# Patient Record
Sex: Female | Born: 1945 | Race: White | Hispanic: No | Marital: Married | Smoking: Never smoker
Health system: Southern US, Community
[De-identification: ages and names within clinical notes are randomized; demographics above are authoritative.]

## PROBLEM LIST (undated history)

## (undated) DIAGNOSIS — I10 Essential (primary) hypertension: Secondary | ICD-10-CM

## (undated) DIAGNOSIS — C50919 Malignant neoplasm of unspecified site of unspecified female breast: Secondary | ICD-10-CM

## (undated) DIAGNOSIS — E7800 Pure hypercholesterolemia, unspecified: Secondary | ICD-10-CM

## (undated) DIAGNOSIS — E559 Vitamin D deficiency, unspecified: Secondary | ICD-10-CM

## (undated) DIAGNOSIS — G300 Alzheimer's disease with early onset: Secondary | ICD-10-CM

## (undated) HISTORY — PX: RECONSTRUCTION BREAST W/ TRAM FLAP: SUR1079

## (undated) HISTORY — DX: Malignant neoplasm of unspecified site of unspecified female breast: C50.919

## (undated) HISTORY — DX: Vitamin D deficiency, unspecified: E55.9

## (undated) HISTORY — DX: Pure hypercholesterolemia, unspecified: E78.00

## (undated) HISTORY — DX: Essential (primary) hypertension: I10

---

## 1997-12-25 ENCOUNTER — Ambulatory Visit (HOSPITAL_COMMUNITY): Admission: RE | Admit: 1997-12-25 | Discharge: 1997-12-25 | Payer: Self-pay | Admitting: General Surgery

## 2002-05-24 HISTORY — PX: MASTECTOMY: SHX3

## 2002-07-13 ENCOUNTER — Ambulatory Visit (HOSPITAL_COMMUNITY): Admission: RE | Admit: 2002-07-13 | Discharge: 2002-07-13 | Payer: Self-pay | Admitting: Hematology and Oncology

## 2002-07-13 ENCOUNTER — Encounter: Payer: Self-pay | Admitting: Hematology and Oncology

## 2002-07-16 ENCOUNTER — Ambulatory Visit (HOSPITAL_COMMUNITY): Admission: RE | Admit: 2002-07-16 | Discharge: 2002-07-16 | Payer: Self-pay | Admitting: Hematology and Oncology

## 2002-07-16 ENCOUNTER — Encounter: Payer: Self-pay | Admitting: Hematology and Oncology

## 2002-07-18 ENCOUNTER — Encounter: Payer: Self-pay | Admitting: General Surgery

## 2002-07-18 ENCOUNTER — Other Ambulatory Visit: Admission: RE | Admit: 2002-07-18 | Discharge: 2002-07-18 | Payer: Self-pay | Admitting: *Deleted

## 2002-07-18 ENCOUNTER — Encounter: Admission: RE | Admit: 2002-07-18 | Discharge: 2002-07-18 | Payer: Self-pay | Admitting: General Surgery

## 2002-10-30 ENCOUNTER — Encounter: Admission: RE | Admit: 2002-10-30 | Discharge: 2002-10-30 | Payer: Self-pay | Admitting: Hematology and Oncology

## 2002-10-30 ENCOUNTER — Encounter: Payer: Self-pay | Admitting: Hematology and Oncology

## 2003-04-05 ENCOUNTER — Ambulatory Visit (HOSPITAL_COMMUNITY): Admission: RE | Admit: 2003-04-05 | Discharge: 2003-04-05 | Payer: Self-pay | Admitting: Plastic Surgery

## 2003-04-05 ENCOUNTER — Ambulatory Visit (HOSPITAL_BASED_OUTPATIENT_CLINIC_OR_DEPARTMENT_OTHER): Admission: RE | Admit: 2003-04-05 | Discharge: 2003-04-05 | Payer: Self-pay | Admitting: Plastic Surgery

## 2003-04-26 ENCOUNTER — Encounter (INDEPENDENT_AMBULATORY_CARE_PROVIDER_SITE_OTHER): Payer: Self-pay | Admitting: Specialist

## 2003-04-26 ENCOUNTER — Inpatient Hospital Stay (HOSPITAL_COMMUNITY): Admission: RE | Admit: 2003-04-26 | Discharge: 2003-04-30 | Payer: Self-pay | Admitting: Surgery

## 2003-08-30 ENCOUNTER — Ambulatory Visit (HOSPITAL_BASED_OUTPATIENT_CLINIC_OR_DEPARTMENT_OTHER): Admission: RE | Admit: 2003-08-30 | Discharge: 2003-08-30 | Payer: Self-pay | Admitting: Plastic Surgery

## 2003-09-10 ENCOUNTER — Ambulatory Visit (HOSPITAL_COMMUNITY): Admission: RE | Admit: 2003-09-10 | Discharge: 2003-09-10 | Payer: Self-pay | Admitting: Internal Medicine

## 2006-11-14 ENCOUNTER — Encounter: Admission: RE | Admit: 2006-11-14 | Discharge: 2006-11-14 | Payer: Self-pay | Admitting: Hematology and Oncology

## 2007-11-15 ENCOUNTER — Encounter: Admission: RE | Admit: 2007-11-15 | Discharge: 2007-11-15 | Payer: Self-pay | Admitting: Hematology and Oncology

## 2010-10-09 NOTE — Op Note (Signed)
NAME:  Destiny Norman, Destiny Norman                         ACCOUNT NO.:  [id number]   MEDICAL RECORD NO.:  [id number]                   PATIENT TYPE:  AMB   LOCATION:  DSC                                  FACILITY:  [facility]   PHYSICIAN:  Etter Sjogren, M.D.                  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   PROCEDURE PERFORMED:  Bilateral nipple reconstruction.   SURGEON:  Etter Sjogren, M.D.   CLINICAL NOTE:  This 65 year old woman has had breast cancer and has had  bilateral mastectomy.  She has had bilateral reconstruction with TRAM flaps  and now presents for her bilateral nipple reconstruction.  The procedure  risks were well-understood by her, and she wished to proceed.   DESCRIPTION OF PROCEDURE:  The patient was placed in a full standing  position and the planned sites for nipple reconstruction were marked.  These  were marked with a round spot Band-Aid and she viewed this in a mirror and  agreed with the placement.  She then returned to the room and was placed  supine.  The flaps were designed as a fleur-de-lis flap, inferiorly based.  She was prepped with Betadine and draped with sterile drapes.  Incisions  were made and the flaps were raised, leaving a generous subcutaneous pedicle  at the inferior aspect of the flap.  The flaps had excellent color and  bright red bleeding to the periphery consistent with viability.  The wounds  were irrigated thoroughly with saline and excellent hemostasis was  confirmed.  The donor site was closed with 3-0 Vicryl interrupted, inverted  deep sutures and 3-0 Vicryl and 4-0 chromic simple interrupted sutures for  the skin.  The flaps were then inset, first closing the flaps with  4-0  chromic simple interrupted sutures.  The flaps had excellent color and  bright red bleeding to the periphery consistent with viability.  Small areas  underneath the flaps were then de-epithelialized and the flaps were inset  there with 3-0 Vicryl simple interrupted sutures around the periphery.  Again, they both had excellent color and no undue tension.  Antibiotic  ointment, Xeroform gauze, and eye pads with a hole cut in the center were  applied and tape gently applied to this as a protection.  She tolerated the  procedure well.   DISPOSITION:  Will see her in the office next week.  Etter Sjogren, M.D.    DB/MEDQ  D:  08/30/2003  T:  08/30/2003  Job:  [id number]

## 2010-10-09 NOTE — Discharge Summary (Signed)
NAME:  Destiny Norman, Destiny Norman                         ACCOUNT NO.:  [id number]   MEDICAL RECORD NO.:  [id number]                   PATIENT TYPE:  INP   LOCATION:  5734                                 FACILITY:  [facility]   PHYSICIAN:  Etter Sjogren, M.D.                  DATE OF BIRTH:  Jan 24, 1946   DATE OF ADMISSION:  04/26/2003  DATE OF DISCHARGE:  04/30/2003                                 DISCHARGE SUMMARY   FINAL DIAGNOSIS:  Breast cancer.   PROCEDURE PERFORMED:  Left total mastectomy and bilateral breast  reconstruction using transverse ___________ flaps.   HISTORY OF PRESENT ILLNESS:  The patient is a 65 year old woman who has had  right breast cancer and now is presenting for a left mastectomy.  She  desires breast reconstruction and already has had the delayed procedure of  her TRAM flaps.  She will have reconstruction with bilateral TRAM flaps as  well.  Anesthesia and risks, possible complications were well understood by  her and she wished to proceed.   HOSPITAL COURSE:  On admission, she was taken to surgery at which time the  left mastectomy and the bilateral TRAM flap breast reconstructions were  performed.  She tolerated this very well.  Postoperatively, she did well.  Postoperative hemoglobin was 10.2, hematocrit 29.8.  Electrolytes within  normal limits.  The day following surgery, she was doing well.  She did  respond a fluid bolus during the first postoperative night.  She began to  diurese and the flaps had excellent color and excellent capillary refill, as  did the abdominal wall closure.  Drains were functioning.  No evidence of  any bleeding problems.  She was tolerating clear liquids.  Gradually her  diet was advanced.  She was begun on ambulation.  Antibiotics were continued  for three days intravenously.  She remained afebrile.  On the fourth  postoperative day it was felt that she could be discharged.   DISPOSITION:  Discharged home on a regular diet.  The patient  is to  __________ three times a day and record.   ACTIVITY:  No lifting, no vigorous activities, no raising arms over head.  No shower yet.   DISCHARGE MEDICATIONS:  Darvocet-N 100.  A total of 30 given, one p.o. q.4-  6h. p.r.n. pain.   FOLLOWUP:  1. See me back in the office in three days if the drainage is down, or six     days if the drainage is continuing this week.  She will call our nurse to     discuss that.  2. She will follow up with Dr. Ovidio Kin of general surgery in a few     weeks.  Etter Sjogren, M.D.    DB/MEDQ  D:  04/30/2003  T:  05/01/2003  Job:  [id number]

## 2010-10-09 NOTE — Op Note (Signed)
NAME:  EMILIJA, BOHMAN                         ACCOUNT NO.:  [id number]   MEDICAL RECORD NO.:  [id number]                   PATIENT TYPE:  AMB   LOCATION:  DAY                                  FACILITY:  [facility]   PHYSICIAN:  Lionel December, M.D.                 DATE OF BIRTH:  05-09-1946   DATE OF PROCEDURE:  09/10/2003  DATE OF DISCHARGE:                                 OPERATIVE REPORT   PROCEDURE:  Total colonoscopy.   ENDOSCOPIST:  Lionel December, M.D.   INDICATIONS:  Destiny Norman is a 65 year old Caucasian female who is undergoing  screening colonoscopy.  Family history is negative for colorectal carcinoma.  Personal history is positive for breast carcinoma and she remains in  remission.  She has occasional hematochezia felt to be secondary to  hemorrhoids.  The procedure and risks were reviewed with the patient and  informed consent was obtained.   PREOPERATIVE MEDICATIONS:  Demerol 50 mg IV and Versed 5 mg IV in divided  dose.   FINDINGS:  Procedure performed in endoscopy suite.  The patient's vital  signs and O2 saturation were monitored during the procedure and remained  stable.  The patient was placed in the left lateral recumbent position and  rectal examination was performed.  No abnormality noted on external or  digital exam.   Olympus videoscope was placed in the rectum and advanced under vision into  the sigmoid colon and beyond.  Redundant colon with excellent preparation.  The scope was slowly and carefully advanced to cecum which was identified by  appendiceal orifice and the ileocecal valve.  Pictures were taken for the  record.  As the scope was withdrawn, the colonic mucosa was carefully  examined and no polyps and/or tumor masses noted.  Rectal mucosa was normal.   The scope was retroflexed to examine the anorectal junction.  She had  hemorrhoids above and below the dentate line; 1 above the dentate line as  prominent and had mucosal erythema and irritation.  The  endoscope was  straightened and withdrawn.  The patient tolerated the procedure well.   FINAL DIAGNOSIS:  Normal colonoscopy except internal and external  hemorrhoids felt to be a source of intermittent or occasional hematochezia.   RECOMMENDATIONS:  1. She should continue high fiber diet.  2. Yearly Hemoccults.  3. She may consider her next screening in 10 years from now.      ___________________________________________                                            Lionel December, M.D.   NR/MEDQ  D:  09/10/2003  T:  09/10/2003  Job:  [id number]   cc:   Dr. Aneta Mins, M.D.  [address] [place]  [place] [address]  Fax: [phone]

## 2010-10-09 NOTE — Op Note (Signed)
NAME:  Destiny Norman, Destiny Norman                         ACCOUNT NO.:  [id number]   MEDICAL RECORD NO.:  [id number]                   PATIENT TYPE:  OIB   LOCATION:  5734                                 FACILITY:  [facility]   PHYSICIAN:  Etter Sjogren, M.D.                  DATE OF BIRTH:  [birth year]   DATE OF PROCEDURE:  04/26/2003  DATE OF DISCHARGE:                                 OPERATIVE REPORT   PREOPERATIVE DIAGNOSIS:  Breast cancer.   POSTOPERATIVE DIAGNOSIS:  Breast cancer.   PROCEDURE:  Bilateral breast reconstruction, right and left side, using a  transverse rectus abdominus myocutaneous flaps.   SURGEON:  Etter Sjogren, M.D.   ASSISTANT:  Pleas Patricia, M.D.   ANESTHESIA:  General.   ESTIMATED BLOOD LOSS:  Was 300 mL.   DRAINS:  Two Blakes in the abdomen, one Blake in each chest.   INDICATIONS FOR PROCEDURE:  This 65 year old woman with breast cancer,  having a mastectomy today on the left side, and already had a mastectomy on  the right side.  She desired a reconstruction.  She elected to have  autogenous tissue using a TRAM flap.  These TRAM flaps have been delayed  bilaterally.  The initial procedure and risks plus the complications were  discussed with her, and she understood the initial procedure and wished to  proceed.  This was a planned staged procedure.  The delay had been performed  three weeks prior.   DESCRIPTION OF PROCEDURE:  The patient was taken to the operating room and  placed supine.  After the successful induction of general anesthesia, she  was prepped with Betadine and draped with sterile drapes.  The old  mastectomy scar on the right side was excised, and the right defect  developed, dissecting in the subcutaneous plane above the muscle using  electrocautery.  These incisions with the electrocautery.  A Blake  transposition brought through a separate stab wound inferolaterally and  secured with a #3-0 Prolene suture.  Moist lap was placed.   An  incision was then made around the umbilicus.  The fatty stalk was left  with the umbilicus for viability.  The upper limb of the incision was then  made and the dissection carried down to the underlying fascia, beveling in a  cephalad direction, in order to capture more perforators in the medial  aspect.  The abdominal skin and flap was then elevated off of the underlying  muscle, taking great care to avoid damage to underlying rectus fascia.  The  subcutaneous tunnel was then developed on the right side through and  through, to connect to the right chest.  The lower incision was then made,  and the flap was then divided in the middle, one for each side.  Dissection  was carried out midline for a very short distance a few mm, in order to have  a fascial edge there.  The flaps were elevated out laterally up to the  lateral row of perforators.  Parallel incisions were made in the anterior  rectus fascia, leaving a strip of fascia on the anterior surface of the  muscle approximately 2.0 cm in width.  The muscles were then gently  dissected free from the surrounding fascia using the bipolar as well as the  scalpel, especially at the tendinous inscriptions.  Once these had been  freed completely on the right side, the lateral perforators were identified  and were ligated with Ligaclips and divided.  The muscle was then divided on  the right side distally, and a flap was then elevated and passed into the  subcutaneous tunnel on the right side.   Dr. Sandria Bales. Newman came in and did the total mastectomy at this point on  the left, and then the muscle was tied on the left side, freeing that muscle  flap.  It was passed through the subcutaneous tunnel as well, and both sides  were positioned on the chest and temporarily stapled in position.  Both  flaps had excellent color and capillary refill with bright red bleeding into  the periphery, consistent with viability.   Attention was then directed  back to the abdomen.  A thorough irrigation with  saline.  Hemostasis with electrocautery.  A portion of the fascia, both  superiorly and inferiorly were closed with #0 Prolene interrupted figure-of-  eight sutures.  Then when this became too tight, this closure was stopped at  that point.  Again after checking for meticulous hemostasis, the Marlex mesh  was then positioned and secured around the periphery with some #0 Prolene  horizontal mattress sutures, again taking great care to avoid damage to the  intra-abdominal contents, as had been done throughout this procedure.  The  mesh was then secured around its periphery using a running #2-0 Prolene  simple suture.  An incision was then made in the middle of the mesh to bring  the umbilicus up through.  The umbilicus had excellent color.  A thorough  irrigation with saline again.  Excellent hemostasis achieved using  electrocautery.  A Blake drain positioned on each side and brought out  through a separate stab wound inferiorly and secured with #3-0 Prolene  suture.  The closure with #2-0 Vicryl interrupted and deep sutures  centrally, #3-0 Monocryl interrupted inverted deep sutures out laterally.  The umbilicus was brought out through the midline and secured with #3-0  Vicryl interrupted inverted deep sutures.  Then #3-0 Monocryl running  subcuticular suture was then placed for the abdominal closure.  All of this  was with the patient in a semi-Fowler's position.  The abdominal skin had  excellent color.   The TRAM flaps were then inset, closing the inferior borders edge to edge,  using #3-0 Monocryl interrupted inverted deep sutures.  The degrees-  epithelialization was performed superiorly, and this was marked.  Suspension  sutures as needed, #3-0 Vicryl interrupted horizontal mattress sutures  superiorly, and #3-0 Monocryl interrupted inverted deep sutures, and #3-0 Monocryl inverted subcuticular suture for the remainder of the  closure.  Again the flaps had excellent color and excellent capillary refill.  Prior  to the closure, the very tip of the flaps out laterally were excised  bilaterally.  There was bright red bleeding there, consistent with  viability.  Steri-Strips and a very light dry sterile dressing applied.  The  patient was turned to the semi-Fowler's position.   She was transferred  to her hospital bed, and transferred to the recovery  room in stable condition.  She tolerated the procedure well.  This was a  very lengthy procedure, requiring approximately 6 to 6-1/2 hours of  operating time.                                               Etter Sjogren, M.D.    DB/MEDQ  D:  04/26/2003  T:  04/28/2003  Job:  [id number]

## 2010-10-09 NOTE — Op Note (Signed)
NAME:  Destiny Norman, Destiny Norman                         ACCOUNT NO.:  [id number]   MEDICAL RECORD NO.:  [id number]                   PATIENT TYPE:  AMB   LOCATION:  DSC                                  FACILITY:  [facility]   PHYSICIAN:  Etter Sjogren, M.D.                  DATE OF BIRTH:  12-25-1945   DATE OF PROCEDURE:  04/05/2003  DATE OF DISCHARGE:                                 OPERATIVE REPORT   PREOPERATIVE DIAGNOSIS:  Right  breast cancer, surgical absence of the right  breast.   POSTOPERATIVE DIAGNOSIS:  Right  breast cancer, surgical absence of the  right breast.   PROCEDURE PERFORMED:  Bilateral delay of a transverse rectus abdominis  muscle flap.   SURGEON:  Etter Sjogren, M.D.   ANESTHESIA:  General.   ESTIMATED BLOOD LOSS:  Minimal.   DRAINS:  One Harrison Mons was left.   CLINICAL NOTE:  65 year old woman has right breast cancer and has had a  mastectomy.  She desires breast reconstruction.  In addition, she will be  having a left mastectomy at the time of her reconstruction by her general  surgeon.  She desires a reconstruction and various options were discussed  and she preferred the TRAM flap reconstruction.  This will be a bilateral  reconstruction.  She presents today for a bilateral delay of that surgery.  The procedure, risks, and possible complications of the TRAM flap were  discussed with her in great detail and she understood those risks and wished  to proceed.   DESCRIPTION OF PROCEDURE:  The patient was marked in a full standing  position for the lower abdominal incision. She was taken to the operating  room and placed supine.  After successful induction of general anesthesia,  she was prepped with Betadine and draped with sterile drapes.  The lower  abdominal incision was then made from the anterior iliac crest to iliac  crest.  The dissection was carried down through the subcutaneous tissue  using electrocautery.  The underlying fascia was identified.  The fascia  was  opened just lateral to the lateral border of the rectus muscle on each side  and the deep epigastric vessels were identified.  These were carefully  doubly and triply ligated using the Liga clips.  Thorough irrigation with  saline and excellent hemostasis was noted.  The closure was 0 Prolene  interrupted figure-of-eight sutures.  0.5% Marcaine was placed in the  fascia.  The wounds were thoroughly irrigated with saline.  Excellent  hemostasis having been confirmed, the Blake drain was positioned and brought  out through the lateral aspect of the wound and secured with 3-0 Prolene  suture.  The closure in layers with 3-0 Monocryl interrupted or deep  sutures, 4-0 Monocryl running subcuticular tissues.  Steri-Strips and dry,  sterile dressing was applied.  Abdominal binder applied.  The patient  tolerated the procedure well.  Etter Sjogren, M.D.    DB/MEDQ  D:  04/05/2003  T:  04/05/2003  Job:  [id number]

## 2010-10-09 NOTE — Op Note (Signed)
NAME:  Destiny Norman, Destiny Norman                         ACCOUNT NO.:  [id number]   MEDICAL RECORD NO.:  [id number]                   PATIENT TYPE:  OIB   LOCATION:  5734                                 FACILITY:  [facility]   PHYSICIAN:  Sandria Bales. Ezzard Standing, M.D.               DATE OF BIRTH:  July 04, 1945   DATE OF PROCEDURE:  04/26/2003  DATE OF DISCHARGE:                                 OPERATIVE REPORT   PREOPERATIVE DIAGNOSIS:  Stage 1 carcinoma of the right breast, plan for  prophylactic mastectomy  of the left breast.   POSTOPERATIVE DIAGNOSIS:  Stage 1 carcinoma of the right breast, plan for  prophylactic mastectomy  of the left breast.   PROCEDURE:  Left simple mastectomy  by Dr. Ovidio Kin, TRAM reconstruction  by Dr. Etter Sjogren.   ANESTHESIA:  General endotracheal anesthesia.   ESTIMATED BLOOD LOSS:  100 mL.   DRAINS:  Per Dr. Odis Luster.   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 She subsequently developed an invasive cancer of the right breast,  treated with a right mastectomy  and sentinel lymph node biopsy in March  2004. She had 2 tumors; one 1.4 cm and the other 9 mm. She had a negative  sentinel lymph node. She has been under treatment by Dr. Margo Common with  remidex and doing well with this, but is interested in 2 things:  First a  prophylactic mastectomy  of the left side because of increased  risk of  breast cancer, and secondly reconstruction for which she will see Dr. Etter Sjogren.   I discussed with her the indications and potential complications of the  procedure. The potential complications include but are not limited to  bleeding, infection, nerve injury. The plan is for me to do a left simple  mastectomy  and Dr. Etter Sjogren will do a bilateral TRAM reconstruction of  her breast.   DESCRIPTION OF PROCEDURE:  The patient underwent  a general  intravenous  anesthetic. She had PAF stockings placed and was given 1 gm of Ancef at the  initiation of the procedure. She was placed in the supine position. Dr.  Odis Luster actually had already done much of the right TRAM reconstruction. By  the time I got in the operating room he had probably been operating for 1-  1/2 to 2 hours. At this time he broke scrub and I started on her left  mastectomy.   I marked her breast, excising her skin  and making an elliptical incision  around the nipple. I then went medial to the lateral edge of the sternum  inferior to the vesting fascia of the rectus abdominus superiorly to about 2  fingerbreadths below the clavicle and laterally out to the latissimus dorsi  muscle. I then  resected the breast off the chest wall using bovie  electrocautery and 3-0 Vicryl sutures for hemostasis.   After completion of the mastectomy, she had an open wound on the left side.  Dr. Odis Luster was then going to work on the reconstruction on the left side. So  I then broke scrub and Dr. Odis Luster came back in to complete the bilateral  TRAM reconstruction.   The patient tolerated the procedure well during my procedure. Her blood loss  was maybe 75 to 100 mL. Again the drains will be dictated by Dr. Odis Luster and  wound closure dictated by Dr. Odis Luster. Final pathology is pending  at the  time of dictation.                                               Sandria Bales. Ezzard Standing, M.D.    DHN/MEDQ  D:  04/26/2003  T:  04/27/2003  Job:  [id number]   cc:   Destiny Norman  [address]  [place]  [place] [address]  Fax: [phone]   Etter Sjogren, M.D.  [address]  [address]  [place]  [place] [address]  Fax: [phone]   Lynett Fish, M.D.  [address] [place]  [place] [address]  Fax: [phone]

## 2010-12-07 ENCOUNTER — Other Ambulatory Visit: Payer: Self-pay | Admitting: Unknown Physician Specialty

## 2010-12-07 DIAGNOSIS — Z9011 Acquired absence of right breast and nipple: Secondary | ICD-10-CM

## 2010-12-07 DIAGNOSIS — Z1231 Encounter for screening mammogram for malignant neoplasm of breast: Secondary | ICD-10-CM

## 2010-12-15 ENCOUNTER — Ambulatory Visit
Admission: RE | Admit: 2010-12-15 | Discharge: 2010-12-15 | Disposition: A | Discharge status: Home / Self Care | Payer: BC Managed Care – PPO | Source: Ambulatory Visit | Attending: Unknown Physician Specialty | Admitting: Unknown Physician Specialty

## 2010-12-15 DIAGNOSIS — Z9011 Acquired absence of right breast and nipple: Secondary | ICD-10-CM

## 2010-12-15 DIAGNOSIS — Z1231 Encounter for screening mammogram for malignant neoplasm of breast: Secondary | ICD-10-CM

## 2011-09-07 ENCOUNTER — Other Ambulatory Visit: Payer: Self-pay | Admitting: Unknown Physician Specialty

## 2011-09-07 DIAGNOSIS — Z1231 Encounter for screening mammogram for malignant neoplasm of breast: Secondary | ICD-10-CM

## 2011-10-07 ENCOUNTER — Ambulatory Visit
Admission: RE | Admit: 2011-10-07 | Discharge: 2011-10-07 | Disposition: A | Discharge status: Home / Self Care | Payer: BC Managed Care – PPO | Source: Ambulatory Visit | Attending: Unknown Physician Specialty | Admitting: Unknown Physician Specialty

## 2011-10-07 DIAGNOSIS — Z1231 Encounter for screening mammogram for malignant neoplasm of breast: Secondary | ICD-10-CM

## 2012-05-11 ENCOUNTER — Ambulatory Visit (INDEPENDENT_AMBULATORY_CARE_PROVIDER_SITE_OTHER): Payer: Medicare Other | Admitting: Otolaryngology

## 2012-05-11 DIAGNOSIS — H6120 Impacted cerumen, unspecified ear: Secondary | ICD-10-CM

## 2012-05-11 DIAGNOSIS — H6980 Other specified disorders of Eustachian tube, unspecified ear: Secondary | ICD-10-CM

## 2012-05-11 DIAGNOSIS — J310 Chronic rhinitis: Secondary | ICD-10-CM

## 2014-01-04 ENCOUNTER — Encounter (HOSPITAL_COMMUNITY): Payer: 59

## 2014-01-04 ENCOUNTER — Encounter (HOSPITAL_COMMUNITY): Payer: 59 | Attending: Hematology and Oncology

## 2014-01-04 ENCOUNTER — Encounter (HOSPITAL_COMMUNITY): Payer: Self-pay

## 2014-01-04 VITALS — BP 164/85 | HR 76 | Temp 98.7°F | Resp 18 | Ht 59.5 in | Wt 124.0 lb

## 2014-01-04 DIAGNOSIS — Z170 Estrogen receptor positive status [ER+]: Secondary | ICD-10-CM | POA: Insufficient documentation

## 2014-01-04 DIAGNOSIS — Z809 Family history of malignant neoplasm, unspecified: Secondary | ICD-10-CM | POA: Diagnosis not present

## 2014-01-04 DIAGNOSIS — C50911 Malignant neoplasm of unspecified site of right female breast: Secondary | ICD-10-CM

## 2014-01-04 DIAGNOSIS — E559 Vitamin D deficiency, unspecified: Secondary | ICD-10-CM | POA: Diagnosis not present

## 2014-01-04 DIAGNOSIS — I10 Essential (primary) hypertension: Secondary | ICD-10-CM | POA: Insufficient documentation

## 2014-01-04 DIAGNOSIS — E7800 Pure hypercholesterolemia, unspecified: Secondary | ICD-10-CM | POA: Diagnosis not present

## 2014-01-04 DIAGNOSIS — R071 Chest pain on breathing: Secondary | ICD-10-CM

## 2014-01-04 DIAGNOSIS — D0590 Unspecified type of carcinoma in situ of unspecified breast: Secondary | ICD-10-CM | POA: Diagnosis present

## 2014-01-04 DIAGNOSIS — Z8249 Family history of ischemic heart disease and other diseases of the circulatory system: Secondary | ICD-10-CM | POA: Diagnosis not present

## 2014-01-04 DIAGNOSIS — C50919 Malignant neoplasm of unspecified site of unspecified female breast: Secondary | ICD-10-CM

## 2014-01-04 DIAGNOSIS — Z880 Allergy status to penicillin: Secondary | ICD-10-CM | POA: Diagnosis not present

## 2014-01-04 DIAGNOSIS — Z9010 Acquired absence of unspecified breast and nipple: Secondary | ICD-10-CM | POA: Diagnosis not present

## 2014-01-04 DIAGNOSIS — R0789 Other chest pain: Secondary | ICD-10-CM

## 2014-01-04 DIAGNOSIS — Z7982 Long term (current) use of aspirin: Secondary | ICD-10-CM | POA: Diagnosis not present

## 2014-01-04 LAB — COMPREHENSIVE METABOLIC PANEL
ALT: 10 U/L (ref 0–35)
AST: 18 U/L (ref 0–37)
Albumin: 4.2 g/dL (ref 3.5–5.2)
Alkaline Phosphatase: 79 U/L (ref 39–117)
Anion gap: 15 (ref 5–15)
BUN: 16 mg/dL (ref 6–23)
CO2: 25 mEq/L (ref 19–32)
Calcium: 9.7 mg/dL (ref 8.4–10.5)
Chloride: 97 mEq/L (ref 96–112)
Creatinine, Ser: 0.83 mg/dL (ref 0.50–1.10)
GFR calc Af Amer: 83 mL/min — ABNORMAL LOW (ref >90)
GFR calc non Af Amer: 72 mL/min — ABNORMAL LOW (ref >90)
Glucose, Bld: 116 mg/dL — ABNORMAL HIGH (ref 70–99)
Potassium: 4.3 mEq/L (ref 3.7–5.3)
Sodium: 137 mEq/L (ref 137–147)
Total Bilirubin: 0.3 mg/dL (ref 0.3–1.2)
Total Protein: 8 g/dL (ref 6.0–8.3)

## 2014-01-04 LAB — CBC WITH DIFFERENTIAL/PLATELET
Basophils Absolute: 0 10*3/uL (ref 0.0–0.1)
Basophils Relative: 0 % (ref 0–1)
Eosinophils Absolute: 0.1 10*3/uL (ref 0.0–0.7)
Eosinophils Relative: 2 % (ref 0–5)
HCT: 40.3 % (ref 36.0–46.0)
Hemoglobin: 13.6 g/dL (ref 12.0–15.0)
Lymphocytes Relative: 24 % (ref 12–46)
Lymphs Abs: 1.2 10*3/uL (ref 0.7–4.0)
MCH: 29.6 pg (ref 26.0–34.0)
MCHC: 33.7 g/dL (ref 30.0–36.0)
MCV: 87.6 fL (ref 78.0–100.0)
Monocytes Absolute: 0.4 10*3/uL (ref 0.1–1.0)
Monocytes Relative: 7 % (ref 3–12)
Neutro Abs: 3.3 10*3/uL (ref 1.7–7.7)
Neutrophils Relative %: 67 % (ref 43–77)
Platelets: 293 10*3/uL (ref 150–400)
RBC: 4.6 MIL/uL (ref 3.87–5.11)
RDW: 13.1 % (ref 11.5–15.5)
WBC: 4.9 10*3/uL (ref 4.0–10.5)

## 2014-01-04 NOTE — Progress Notes (Signed)
[place] A. Barnet Glasgow, M.D.  NEW PATIENT EVALUATION   Name: Destiny Norman Date: 01/04/2014 MRN: [id number] DOB: July 30, 1945  PCP: Delphina Cahill, MD   REFERRING PHYSICIAN: No ref. provider found  REASON FOR REFERRAL: Followup of breast cancer.     HISTORY OF PRESENT ILLNESS:Destiny Norman is a 68 y.o. female referred by her family physician for evaluation of new symptomatology. She had been diagnosed with lobular carcinoma in situ and was started preventatively on Evista and have taken the drug for nearly 5 years when she developed an abnormality in her right breast which on biopsy was consistent with ER positive infiltrating duct cell carcinoma. 2 lesions were ultimately found and she underwent right mastectomy with sentinel node biopsy, prophylactic left mastectomy with TRAM reconstruction. The TRAM reconstruction was performed 9 months after initial mastectomy on the right. She was then treated with anastrozole for 5 years ending in April of 2009. Right from the Star, she noticed some dissymmetry of her right breast reconstruction but never did she experience pain. The skin itself is numb. She denies any right upper extremity swelling, sore throat, chest pain, PND, orthopnea, palpitations, abdominal pain, nausea, vomiting, diarrhea, constipation, dysuria, hematuria, incontinence, lower extremity swelling or redness, skin rash, headache, or seizures.  PAST MEDICAL HISTORY:  has a past medical history of Breast cancer; High cholesterol; Vitamin D deficiency; and Hypertension.   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 Rx with anastrozole until April of 2009. 07/2002: Bilateral  mastectomy with TRAM reconstruction.  PAST SURGICAL HISTORY: Past Surgical History  Procedure Laterality Date  . Mastectomy Right 2004  . Cesarean section       CURRENT MEDICATIONS: has a current medication list which includes the following prescription(s): aspirin, vitamin d3, lisinopril-hydrochlorothiazide, melatonin, probiotic product, and simvastatin.   ALLERGIES: Penicillins   SOCIAL HISTORY:  reports that she has never smoked. She does not have any smokeless tobacco history on file. She reports that she drinks alcohol.   FAMILY HISTORY: family history includes Cancer in her brother; Hypertension in her mother.    REVIEW OF SYSTEMS:  Other than that discussed above is noncontributory.    PHYSICAL EXAM:  height is 4' 11.5" (1.511 m) and weight is 124 lb (56.246 kg). Her oral temperature is 98.7 F (37.1 C). Her blood pressure is 164/85 and her pulse is 76. Her respiration is 18.    GENERAL:alert, no distress and comfortable SKIN: skin color, texture, turgor are normal, no rashes or significant lesions EYES: normal, Conjunctiva are pink and non-injected, sclera clear OROPHARYNX:no exudate, no erythema and lips, buccal mucosa, and tongue normal  NECK: supple, thyroid normal size, non-tender, without nodularity CHEST:  Status post bilateral breast reconstruction with no masses felt. No subcutaneous nodules are appreciated in either reconstructed breast. LYMPH:  no palpable lymphadenopathy in the cervical, axillary or inguinal LUNGS: clear to auscultation and percussion with normal breathing effort HEART: regular rate & rhythm and no murmurs ABDOMEN:abdomen soft, non-tender and normal bowel sounds MUSCULOSKELETALl:no cyanosis of digits, no clubbing or edema  NEURO: alert & oriented x 3 with fluent speech, no focal motor/sensory deficits    LABORATORY DATA:  Appointment on 01/04/2014  Component Date Value Ref Range Status  . WBC  01/04/2014 4.9  4.0 - 10.5 K/uL Final  .  RBC 01/04/2014 4.60  3.87 - 5.11 MIL/uL Final  . Hemoglobin 01/04/2014 13.6  12.0 - 15.0 g/dL Final  . HCT 01/04/2014 40.3  36.0 - 46.0 % Final  . MCV 01/04/2014 87.6  78.0 - 100.0 fL Final  . MCH 01/04/2014 29.6  26.0 - 34.0 pg Final  . MCHC 01/04/2014 33.7  30.0 - 36.0 g/dL Final  . RDW 01/04/2014 13.1  11.5 - 15.5 % Final  . Platelets 01/04/2014 293  150 - 400 K/uL Final  . Neutrophils Relative % 01/04/2014 67  43 - 77 % Final  . Neutro Abs 01/04/2014 3.3  1.7 - 7.7 K/uL Final  . Lymphocytes Relative 01/04/2014 24  12 - 46 % Final  . Lymphs Abs 01/04/2014 1.2  0.7 - 4.0 K/uL Final  . Monocytes Relative 01/04/2014 7  3 - 12 % Final  . Monocytes Absolute 01/04/2014 0.4  0.1 - 1.0 K/uL Final  . Eosinophils Relative 01/04/2014 2  0 - 5 % Final  . Eosinophils Absolute 01/04/2014 0.1  0.0 - 0.7 K/uL Final  . Basophils Relative 01/04/2014 0  0 - 1 % Final  . Basophils Absolute 01/04/2014 0.0  0.0 - 0.1 K/uL Final  . Sodium 01/04/2014 137  137 - 147 mEq/L Final  . Potassium 01/04/2014 4.3  3.7 - 5.3 mEq/L Final  . Chloride 01/04/2014 97  96 - 112 mEq/L Final  . CO2 01/04/2014 25  19 - 32 mEq/L Final  . Glucose, Bld 01/04/2014 116* 70 - 99 mg/dL Final  . BUN 01/04/2014 16  6 - 23 mg/dL Final  . Creatinine, Ser 01/04/2014 0.83  0.50 - 1.10 mg/dL Final  . Calcium 01/04/2014 9.7  8.4 - 10.5 mg/dL Final  . Total Protein 01/04/2014 8.0  6.0 - 8.3 g/dL Final  . Albumin 01/04/2014 4.2  3.5 - 5.2 g/dL Final  . AST 01/04/2014 18  0 - 37 U/L Final  . ALT 01/04/2014 10  0 - 35 U/L Final  . Alkaline Phosphatase 01/04/2014 79  39 - 117 U/L Final  . Total Bilirubin 01/04/2014 0.3  0.3 - 1.2 mg/dL Final  . GFR calc non Af Amer 01/04/2014 72* >90 mL/min Final  . GFR calc Af Amer 01/04/2014 83* >90 mL/min Final   Comment: (NOTE)                          The eGFR has been calculated using the CKD EPI equation.                          This calculation has not been validated in all clinical situations.                           eGFR's persistently <90 mL/min signify possible Chronic Kidney                          Disease.  . Anion gap 01/04/2014 15  5 - 15 Final    Urinalysis No results found for this basename: colorurine,  appearanceur,  labspec,  phurine,  glucoseu,  hgbur,  bilirubinur,  ketonesur,  proteinur,  urobilinogen,  nitrite,  leukocytesur      _0 : Bone mineral density done on 12/03/2009 showed osteopenia.  PATHOLOGY: infiltrating duct cell carcinoma, ER/PR positive, HER-2/neu  not overexpressed with multi-focal disease, 0.9 cm and 1.2 cm respectively. Core biopsy was performed on 07/06/2002   IMPRESSION:  #1. 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 #2. Multifocal right breast cancer, 0.9 cm and 1.2 cm, ER/PR positive, HER-2/neu not overexpressed, status post right mastectomy with sentinel node biopsy performed on 08/01/2002 with subsequent left simple mastectomy 9 months later with bilateral TRAM reconstruction, no evidence of disease pending today's lab reports. #3. Hypertension, controlled.   PLAN:  #1. Lab tests were done today. #2. Suggested referral back to plastic surgeon. MRI of the right breast should probably be performed. #3. No further systemic therapy is recommended at this time. #4. Tentative followup in one year with CBC, chem profile, CEA, CA 27-29, and vitamin D level. #5. Bone mineral density every 2 years.  I appreciate the opportunity of sharing in her care.   Doroteo Bradford, MD 01/04/2014 7:22 PM   DISCLAIMER:  This note was dictated with voice recognition softwre.  Similar sounding words can inadvertently be transcribed inaccurately and may not be corrected upon review.

## 2014-01-04 NOTE — Patient Instructions (Signed)
[facility] Discharge Instructions  RECOMMENDATIONS MADE BY THE CONSULTANT AND ANY TEST RESULTS WILL BE SENT TO YOUR REFERRING PHYSICIAN.  Follow up with your surgeon who did your reconstructive surgery. Return here in one year for lab work and office visit.   Thank you for choosing [facility] to provide your oncology and hematology care.  To afford each patient quality time with our providers, please arrive at least 15 minutes before your scheduled appointment time.  With your help, our goal is to use those 15 minutes to complete the necessary work-up to ensure our physicians have the information they need to help with your evaluation and healthcare recommendations.    Effective January 1st, 2014, we ask that you re-schedule your appointment with our physicians should you arrive 10 or more minutes late for your appointment.  We strive to give you quality time with our providers, and arriving late affects you and other patients whose appointments are after yours.    Again, thank you for choosing [facility].  Our hope is that these requests will decrease the amount of time that you wait before being seen by our physicians.       _____________________________________________________________  Should you have questions after your visit to [facility], please contact our office at [phone] between the hours of 8:30 a.m. and 4:30 p.m.  Voicemails left after 4:30 p.m. will not be returned until the following business day.  For prescription refill requests, have your pharmacy contact our office with your prescription refill request.    _______________________________________________________________  We hope that we have given you very good care.  You may receive a patient satisfaction survey in the mail, please complete it and return it as soon as possible.  We value your  feedback!  _______________________________________________________________  Have you asked about our STAR program?  STAR stands for Survivorship Training and Rehabilitation, and this is a nationally recognized cancer care program that focuses on survivorship and rehabilitation.  Cancer and cancer treatments may cause problems, such as, pain, making you feel tired and keeping you from doing the things that you need or want to do. Cancer rehabilitation can help. Our goal is to reduce these troubling effects and help you have the best quality of life possible.  You may receive a survey from a nurse that asks questions about your current state of health.  Based on the survey results, all eligible patients will be referred to the [facility] program for an evaluation so we can better serve you!  A frequently asked questions sheet is available upon request.

## 2014-01-05 LAB — CEA: CEA: <0.5 ng/mL (ref 0.0–5.0)

## 2014-01-05 LAB — CANCER ANTIGEN 27.29: CA 27.29: 26 U/mL (ref 0–39)

## 2014-01-05 LAB — VITAMIN D 25 HYDROXY (VIT D DEFICIENCY, FRACTURES): Vit D, 25-Hydroxy: 53 ng/mL (ref 30–89)

## 2014-03-20 ENCOUNTER — Encounter (INDEPENDENT_AMBULATORY_CARE_PROVIDER_SITE_OTHER): Payer: Self-pay | Admitting: *Deleted

## 2014-04-10 ENCOUNTER — Other Ambulatory Visit (INDEPENDENT_AMBULATORY_CARE_PROVIDER_SITE_OTHER): Payer: Self-pay | Admitting: *Deleted

## 2014-04-10 ENCOUNTER — Encounter (INDEPENDENT_AMBULATORY_CARE_PROVIDER_SITE_OTHER): Payer: Self-pay | Admitting: *Deleted

## 2014-04-10 DIAGNOSIS — Z1211 Encounter for screening for malignant neoplasm of colon: Secondary | ICD-10-CM

## 2014-05-28 ENCOUNTER — Telehealth (INDEPENDENT_AMBULATORY_CARE_PROVIDER_SITE_OTHER): Payer: Self-pay | Admitting: *Deleted

## 2014-05-28 DIAGNOSIS — Z1211 Encounter for screening for malignant neoplasm of colon: Secondary | ICD-10-CM

## 2014-05-28 NOTE — Telephone Encounter (Signed)
Patient needs movi prep 

## 2014-05-31 MED ORDER — PEG-KCL-NACL-NASULF-NA ASC-C 100 G PO SOLR: 1.0000 | Freq: Once | ORAL | Status: DC

## 2014-06-04 ENCOUNTER — Telehealth (INDEPENDENT_AMBULATORY_CARE_PROVIDER_SITE_OTHER): Payer: Self-pay | Admitting: *Deleted

## 2014-06-04 NOTE — Telephone Encounter (Signed)
Referring MD/PCP: hall   Procedure: tcs  Reason/Indication:  screening  Has patient had this procedure before?  Yes, 10 yrs ago  If so, when, by whom and where?    Is there a family history of colon cancer?  no  Who?  What age when diagnosed?    Is patient diabetic?   no      Does patient have prosthetic heart valve?  no  Do you have a pacemaker?  no  Has patient ever had endocarditis? no  Has patient had joint replacement within last 12 months?  no  Does patient tend to be constipated or take laxatives? no  Is patient on Coumadin, Plavix and/or Aspirin? no  Medications: pravastatin 40 mg daily, lisinopril/hctz 10/12.5 mg daily, vit d3, melatonin, probiotic  Allergies: pcn  Medication Adjustment:   Procedure date & time: 07/04/14 at 830

## 2014-06-07 NOTE — Telephone Encounter (Signed)
agree

## 2014-07-04 ENCOUNTER — Encounter (HOSPITAL_COMMUNITY): Payer: Self-pay | Admitting: *Deleted

## 2014-07-04 ENCOUNTER — Ambulatory Visit (HOSPITAL_COMMUNITY)
Admission: RE | Admit: 2014-07-04 | Discharge: 2014-07-04 | Disposition: A | Discharge status: Home / Self Care | Payer: Medicare Other | Source: Ambulatory Visit | Attending: Internal Medicine | Admitting: Internal Medicine

## 2014-07-04 ENCOUNTER — Encounter (HOSPITAL_COMMUNITY)
Admission: RE | Disposition: A | Discharge status: Home / Self Care | Payer: Self-pay | Source: Ambulatory Visit | Attending: Internal Medicine

## 2014-07-04 DIAGNOSIS — E780 Pure hypercholesterolemia: Secondary | ICD-10-CM | POA: Diagnosis not present

## 2014-07-04 DIAGNOSIS — Q438 Other specified congenital malformations of intestine: Secondary | ICD-10-CM

## 2014-07-04 DIAGNOSIS — E559 Vitamin D deficiency, unspecified: Secondary | ICD-10-CM | POA: Diagnosis not present

## 2014-07-04 DIAGNOSIS — Z1211 Encounter for screening for malignant neoplasm of colon: Secondary | ICD-10-CM

## 2014-07-04 DIAGNOSIS — I10 Essential (primary) hypertension: Secondary | ICD-10-CM | POA: Insufficient documentation

## 2014-07-04 DIAGNOSIS — Z8249 Family history of ischemic heart disease and other diseases of the circulatory system: Secondary | ICD-10-CM | POA: Diagnosis not present

## 2014-07-04 DIAGNOSIS — Z79899 Other long term (current) drug therapy: Secondary | ICD-10-CM | POA: Diagnosis not present

## 2014-07-04 DIAGNOSIS — K6289 Other specified diseases of anus and rectum: Secondary | ICD-10-CM

## 2014-07-04 DIAGNOSIS — K648 Other hemorrhoids: Secondary | ICD-10-CM

## 2014-07-04 HISTORY — PX: COLONOSCOPY: SHX5424

## 2014-07-04 SURGERY — COLONOSCOPY
Anesthesia: Moderate Sedation

## 2014-07-04 MED ORDER — MEPERIDINE HCL 50 MG/ML IJ SOLN
INTRAMUSCULAR | Status: AC
  Filled 2014-07-04: qty 1

## 2014-07-04 MED ORDER — MIDAZOLAM HCL 5 MG/5ML IJ SOLN
INTRAMUSCULAR | Status: DC | PRN
  Administered 2014-07-04: 1 mg via INTRAVENOUS
  Administered 2014-07-04 (×2): 2 mg via INTRAVENOUS
  Administered 2014-07-04 (×2): 1 mg via INTRAVENOUS

## 2014-07-04 MED ORDER — MEPERIDINE HCL 50 MG/ML IJ SOLN
INTRAMUSCULAR | Status: DC | PRN
Start: 2014-07-04 — End: 2014-07-04
  Administered 2014-07-04 (×2): 25 mg via INTRAVENOUS

## 2014-07-04 MED ORDER — SODIUM CHLORIDE 0.9 % IV SOLN
INTRAVENOUS | Status: DC
  Administered 2014-07-04: 08:00:00 via INTRAVENOUS

## 2014-07-04 MED ORDER — STERILE WATER FOR IRRIGATION IR SOLN
Status: DC | PRN
  Administered 2014-07-04: 09:00:00

## 2014-07-04 MED ORDER — MIDAZOLAM HCL 5 MG/5ML IJ SOLN
INTRAMUSCULAR | Status: DC
Start: 2014-07-04 — End: 2014-07-04
  Filled 2014-07-04: qty 10

## 2014-07-04 NOTE — Op Note (Signed)
COLONOSCOPY PROCEDURE REPORT  PATIENT:  Destiny Norman  MR#:  [id number] Birthdate:  29-Apr-1946, 69 y.o., female Endoscopist:  Dr. Rogene Houston, MD Referred By:  Dr. Delphina Cahill, MD Procedure Date: 07/04/2014  Procedure:   Colonoscopy;incomplete to distal transverse colon.  Indications: Patient is 69 year old Caucasian female was undergoing average risk screening colonoscopy.  Informed Consent:  The procedure and risks were reviewed with the patient and informed consent was obtained.  Medications:  Demerol 50 mg IV Versed 7 mg IV  Description of procedure:  After a digital rectal exam was performed, that colonoscope was advanced from the anus through the rectum and colon to the area of the distal transverse colon. Splenic flexure is very tortuous. Scope could not be straightened after passing into distal transverse colon. Therefore cecum not reached. As the scope was withdrawn mucosa of descending and sigmoid colon was carefully examined. The scope was pulled down into the rectum where a thorough exam including retroflexion was performed.  Findings:   Prep excellent. Tortuous colon with recurrent loop formation. Therefore examination could not be completed. No abnormality noted in in the segments that were examined. Normal rectal mucosa. Hemorrhoids above and below the dentate line along with anal papillae.    Therapeutic/Diagnostic Maneuvers Performed:   None  Complications:  None  Cecal Withdrawal Time:  N/A as cecum not reached.  Impression:  Incomplete exam to distal transverse colon secondary to very tortuous sigmoid colon and splenic flexure.  Recommendations:  Standard instructions given. Virtual colonoscopy to be scheduled in 4 weeks.  Bailley Guilford U  07/04/2014 9:15 AM  CC: Dr. Delphina Cahill, MD & Dr. Rayne Du ref. provider found

## 2014-07-04 NOTE — Discharge Instructions (Signed)
Resume usual medications and diet. No driving for 24 hours. Virtual colonoscopy be scheduled in 4 weeks.  Colonoscopy, Care After Refer to this sheet in the next few weeks. These instructions provide you with information on caring for yourself after your procedure. Your health care provider may also give you more specific instructions. Your treatment has been planned according to current medical practices, but problems sometimes occur. Call your health care provider if you have any problems or questions after your procedure. WHAT TO EXPECT AFTER THE PROCEDURE  After your procedure, it is typical to have the following:  A small amount of blood in your stool.  Moderate amounts of gas and mild abdominal cramping or bloating. HOME CARE INSTRUCTIONS  Do not drive, operate machinery, or sign important documents for 24 hours.  You may shower and resume your regular physical activities, but move at a slower pace for the first 24 hours.  Take frequent rest periods for the first 24 hours.  Walk around or put a warm pack on your abdomen to help reduce abdominal cramping and bloating.  Drink enough fluids to keep your urine clear or pale yellow.  You may resume your normal diet as instructed by your health care provider. Avoid heavy or fried foods that are hard to digest.  Avoid drinking alcohol for 24 hours or as instructed by your health care provider.  Only take over-the-counter or prescription medicines as directed by your health care provider.  If a tissue sample (biopsy) was taken during your procedure:  Do not take aspirin or blood thinners for 7 days, or as instructed by your health care provider.  Do not drink alcohol for 7 days, or as instructed by your health care provider.  Eat soft foods for the first 24 hours. SEEK MEDICAL CARE IF: You have persistent spotting of blood in your stool 2-3 days after the procedure. SEEK IMMEDIATE MEDICAL CARE IF:  You have more than a small  spotting of blood in your stool.  You pass large blood clots in your stool.  Your abdomen is swollen (distended).  You have nausea or vomiting.  You have a fever.  You have increasing abdominal pain that is not relieved with medicine. Document Released: 12/23/2003 Document Revised: 02/28/2013 Document Reviewed: 01/15/2013 [facility] Patient Information 2015 [place], [place]. This information is not intended to replace advice given to you by your health care provider. Make sure you discuss any questions you have with your health care provider.

## 2014-07-04 NOTE — H&P (Signed)
Destiny Norman is an 69 y.o. female.   Chief Complaint: Patient is here for colonoscopy.Destiny Norman HPI: Patient is 69 year old Caucasian female who is undergoing screening colonoscopy. Her last exam was in 2005. She denies abdominal pain change in bowel habits or rectal bleeding.  Personal history is significant for breast carcinoma and she remains in remission. Family history is negative for CRC.  Past Medical History  Diagnosis Date  . Breast cancer   . High cholesterol   . Vitamin D deficiency   . Hypertension     Past Surgical History  Procedure Laterality Date  . Mastectomy Right 2004  . Cesarean section      Family History  Problem Relation Age of Onset  . Hypertension Mother   . Cancer Brother    Social History:  reports that she has never smoked. She does not have any smokeless tobacco history on file. She reports that she drinks alcohol. Her drug history is not on file.  Allergies:  Allergies  Allergen Reactions  . Penicillins Rash    Medications Prior to Admission  Medication Sig Dispense Refill  . Cholecalciferol (VITAMIN D3) 1000 UNITS CAPS Take 1,000 Units by mouth daily.    Destiny Norman lisinopril-hydrochlorothiazide (PRINZIDE,ZESTORETIC) 10-12.5 MG per tablet Take 1 tablet by mouth daily.    . Melatonin 5 MG CAPS Take 5 mg by mouth at bedtime as needed (sleep).     . peg 3350 powder (MOVIPREP) 100 G SOLR Take 1 kit (200 g total) by mouth once. 1 kit 0  . Probiotic Product (PROBIOTIC DAILY PO) Take 1 tablet by mouth daily.    . simvastatin (ZOCOR) 40 MG tablet Take 40 mg by mouth daily.    . vitamin B-12 (CYANOCOBALAMIN) 1000 MCG tablet Take 1,000 mcg by mouth daily.      No results found for this or any previous visit (from the past 48 hour(s)). No results found.  ROS  Blood pressure 133/69, pulse 61, temperature 98 F (36.7 C), temperature source Oral, resp. rate 16, SpO2 96 %. Physical Exam  Constitutional: She appears well-developed and well-nourished.  HENT:   Mouth/Throat: Oropharynx is clear and moist.  Eyes: Conjunctivae are normal. No scleral icterus.  Neck: No thyromegaly present.  Cardiovascular: Normal rate, regular rhythm and normal heart sounds.   No murmur heard. Respiratory: Effort normal and breath sounds normal.  GI: Soft. She exhibits no distension and no mass. There is no tenderness.  Long scar across lower abdomen.  Musculoskeletal: She exhibits no edema.  Lymphadenopathy:    She has no cervical adenopathy.  Neurological: She is alert.  Skin: Skin is warm and dry.     Assessment/Plan Average risk screening colonoscopy.  Jermiah Soderman U 07/04/2014, 8:31 AM

## 2014-07-05 ENCOUNTER — Encounter (HOSPITAL_COMMUNITY): Payer: Self-pay | Admitting: Internal Medicine

## 2014-07-09 ENCOUNTER — Other Ambulatory Visit (INDEPENDENT_AMBULATORY_CARE_PROVIDER_SITE_OTHER): Payer: Self-pay | Admitting: Internal Medicine

## 2014-07-09 DIAGNOSIS — Q438 Other specified congenital malformations of intestine: Secondary | ICD-10-CM

## 2014-08-06 ENCOUNTER — Ambulatory Visit
Admission: RE | Admit: 2014-08-06 | Discharge: 2014-08-06 | Disposition: A | Discharge status: Home / Self Care | Payer: Medicare Other | Source: Ambulatory Visit | Attending: Internal Medicine | Admitting: Internal Medicine

## 2014-08-06 DIAGNOSIS — Q438 Other specified congenital malformations of intestine: Secondary | ICD-10-CM

## 2014-08-14 ENCOUNTER — Other Ambulatory Visit (INDEPENDENT_AMBULATORY_CARE_PROVIDER_SITE_OTHER): Payer: Self-pay | Admitting: Internal Medicine

## 2014-08-14 DIAGNOSIS — D134 Benign neoplasm of liver: Secondary | ICD-10-CM

## 2014-09-05 ENCOUNTER — Encounter (HOSPITAL_COMMUNITY): Payer: Self-pay

## 2014-09-05 ENCOUNTER — Encounter (HOSPITAL_COMMUNITY)
Admission: RE | Admit: 2014-09-05 | Discharge: 2014-09-05 | Disposition: A | Discharge status: Home / Self Care | Payer: Medicare Other | Source: Ambulatory Visit | Attending: Internal Medicine | Admitting: Internal Medicine

## 2014-09-05 ENCOUNTER — Other Ambulatory Visit: Payer: Self-pay

## 2014-09-05 DIAGNOSIS — Z01810 Encounter for preprocedural cardiovascular examination: Secondary | ICD-10-CM | POA: Diagnosis not present

## 2014-09-05 DIAGNOSIS — Z01812 Encounter for preprocedural laboratory examination: Secondary | ICD-10-CM | POA: Insufficient documentation

## 2014-09-05 NOTE — Progress Notes (Signed)
PCP is Dr Wende Neighbors from [facility] (telephone number is [phone])  Pt states she had lab work done on 09-03-14 at Dr Juel Burrow office request sent for results.

## 2014-09-05 NOTE — Pre-Procedure Instructions (Signed)
Trish Mancinelli Gupta  09/05/2014  Your procedure is scheduled on:  April 19  Report to [facility] Admitting at 600 AM.  Call this number if you have problems the morning of surgery: [phone]   Remember:   Do not eat food or drink liquids after midnight.   Take these medicines the morning of surgery with A SIP OF WATER: Usual morning meds    Do not wear jewelry, make-up or nail polish.  Do not wear lotions, powders, or perfumes. You may wear deodorant.  Do not shave 48 hours prior to surgery. Men may shave face and neck.  Do not bring valuables to the hospital.  [facility] is not responsible   for any belongings or valuables.               Contacts, dentures or bridgework may not be worn into surgery.  Leave suitcase in the car. After surgery it may be brought to your room.  For patients admitted to the hospital, discharge time is determined by your   treatment team.               Patients discharged the day of surgery will not be allowed to drive home.    Special Instructions:    Please read over the following fact sheets that you were given: Pain Booklet and Coughing and Deep Breathing

## 2014-09-10 ENCOUNTER — Ambulatory Visit (HOSPITAL_COMMUNITY)
Admission: RE | Admit: 2014-09-10 | Discharge: 2014-09-10 | Disposition: A | Discharge status: Home / Self Care | Payer: Medicare Other | Source: Ambulatory Visit | Attending: Internal Medicine | Admitting: Internal Medicine

## 2014-09-10 ENCOUNTER — Ambulatory Visit (HOSPITAL_COMMUNITY): Payer: Medicare Other | Admitting: Anesthesiology

## 2014-09-10 ENCOUNTER — Encounter (HOSPITAL_COMMUNITY)
Admission: RE | Disposition: A | Discharge status: Home / Self Care | Payer: Medicare Other | Source: Ambulatory Visit | Attending: *Deleted

## 2014-09-10 ENCOUNTER — Encounter (HOSPITAL_COMMUNITY): Payer: Self-pay | Admitting: *Deleted

## 2014-09-10 DIAGNOSIS — Z853 Personal history of malignant neoplasm of breast: Secondary | ICD-10-CM | POA: Insufficient documentation

## 2014-09-10 DIAGNOSIS — Z9010 Acquired absence of unspecified breast and nipple: Secondary | ICD-10-CM | POA: Insufficient documentation

## 2014-09-10 DIAGNOSIS — D1803 Hemangioma of intra-abdominal structures: Secondary | ICD-10-CM | POA: Diagnosis not present

## 2014-09-10 DIAGNOSIS — K7689 Other specified diseases of liver: Secondary | ICD-10-CM | POA: Insufficient documentation

## 2014-09-10 DIAGNOSIS — D134 Benign neoplasm of liver: Secondary | ICD-10-CM

## 2014-09-10 DIAGNOSIS — R160 Hepatomegaly, not elsewhere classified: Secondary | ICD-10-CM | POA: Diagnosis present

## 2014-09-10 HISTORY — PX: RADIOLOGY WITH ANESTHESIA: SHX6223

## 2014-09-10 SURGERY — RADIOLOGY WITH ANESTHESIA
Anesthesia: General

## 2014-09-10 MED ORDER — GADOBENATE DIMEGLUMINE 529 MG/ML IV SOLN
10.0000 mL | Freq: Once | INTRAVENOUS | Status: AC | PRN
  Administered 2014-09-10: 10 mL via INTRAVENOUS

## 2014-09-10 MED ORDER — PROMETHAZINE HCL 25 MG/ML IJ SOLN: 6.2500 mg | INTRAMUSCULAR | Status: DC | PRN

## 2014-09-10 MED ORDER — HYDROMORPHONE HCL 1 MG/ML IJ SOLN: 0.2500 mg | INTRAMUSCULAR | Status: DC | PRN

## 2014-09-10 MED ORDER — OXYCODONE HCL 5 MG PO TABS: 5.0000 mg | ORAL_TABLET | Freq: Once | ORAL | Status: DC | PRN

## 2014-09-10 MED ORDER — OXYCODONE HCL 5 MG/5ML PO SOLN: 5.0000 mg | Freq: Once | ORAL | Status: DC | PRN

## 2014-09-10 NOTE — Anesthesia Preprocedure Evaluation (Addendum)
Anesthesia Evaluation  Patient identified by MRN, date of birth, ID band Patient awake    Reviewed: Allergy & Precautions, NPO status   History of Anesthesia Complications Negative for: history of anesthetic complications  Airway Mallampati: I  TM Distance: >3 FB     Dental  (+) Teeth Intact, Dental Advisory Given   Pulmonary neg pulmonary ROS,    Pulmonary exam normal       Cardiovascular hypertension, Rhythm:Regular     Neuro/Psych negative neurological ROS     GI/Hepatic negative GI ROS, Neg liver ROS,   Endo/Other  negative endocrine ROS  Renal/GU negative Renal ROS     Musculoskeletal   Abdominal (+)  Abdomen: soft.    Peds  Hematology negative hematology ROS (+)   Anesthesia Other Findings   Reproductive/Obstetrics                            Anesthesia Physical Anesthesia Plan  ASA: II  Anesthesia Plan: General   Post-op Pain Management:    Induction: Intravenous  Airway Management Planned: LMA and Oral ETT  Additional Equipment:   Intra-op Plan:   Post-operative Plan: Extubation in OR  Informed Consent: I have reviewed the patients History and Physical, chart, labs and discussed the procedure including the risks, benefits and alternatives for the proposed anesthesia with the patient or authorized representative who has indicated his/her understanding and acceptance.   Dental advisory given  Plan Discussed with:   Anesthesia Plan Comments:         Anesthesia Quick Evaluation

## 2014-09-10 NOTE — Anesthesia Postprocedure Evaluation (Signed)
  Anesthesia Post-op Note  Patient: Destiny Norman  Procedure(s) Performed: Procedure(s): MRI ABDOMIN WITH AND WITHOUT (N/A)  Patient Location: PACU  Anesthesia Type:General  Level of Consciousness: awake and alert   Airway and Oxygen Therapy: Patient Spontanous Breathing  Post-op Pain: none  Post-op Assessment: Post-op Vital signs reviewed  Post-op Vital Signs: stable  Last Vitals:  Filed Vitals:   09/10/14 1033  BP: 137/67  Pulse: 62  Temp:   Resp:     Complications: No apparent anesthesia complications

## 2014-09-10 NOTE — Transfer of Care (Signed)
Immediate Anesthesia Transfer of Care Note  Patient: Destiny Norman  Procedure(s) Performed: Procedure(s): MRI ABDOMIN WITH AND WITHOUT (N/A)  Patient Location: PACU  Anesthesia Type:General  Level of Consciousness: awake, alert , oriented and patient cooperative  Airway & Oxygen Therapy: Patient Spontanous Breathing  Post-op Assessment: Report given to RN, Post -op Vital signs reviewed and stable and Patient moving all extremities  Post vital signs: Reviewed and stable  Last Vitals:  Filed Vitals:   09/10/14 1012  BP:   Pulse: 77  Temp: 36.5 C  Resp: 18    Complications: No apparent anesthesia complications

## 2014-09-11 ENCOUNTER — Encounter (HOSPITAL_COMMUNITY): Payer: Self-pay | Admitting: Radiology

## 2014-09-13 MED FILL — Rocuronium Bromide IV Soln 100 MG/10ML (10 MG/ML): INTRAVENOUS | Qty: 10 | Status: AC

## 2014-09-13 MED FILL — Neostigmine Methylsulfate IV Soln 10 MG/10 ML (1 MG/ML): INTRAVENOUS | Qty: 1 | Status: AC

## 2014-09-13 MED FILL — Phenylephrine HCl Inj 10 MG/ML: INTRAMUSCULAR | Qty: 1 | Status: AC

## 2014-09-13 MED FILL — Lidocaine HCl IV Inj 20 MG/ML: INTRAVENOUS | Qty: 5 | Status: AC

## 2014-09-13 MED FILL — Glycopyrrolate Inj 0.2 MG/ML: INTRAMUSCULAR | Qty: 1 | Status: AC

## 2014-09-13 MED FILL — Midazolam HCl Inj 2 MG/2ML (Base Equivalent): INTRAMUSCULAR | Qty: 2 | Status: AC

## 2014-09-13 MED FILL — Fentanyl Citrate Preservative Free (PF) Inj 100 MCG/2ML: INTRAMUSCULAR | Qty: 2 | Status: AC

## 2014-12-25 ENCOUNTER — Other Ambulatory Visit (HOSPITAL_COMMUNITY): Payer: Self-pay

## 2014-12-26 ENCOUNTER — Other Ambulatory Visit (HOSPITAL_COMMUNITY): Payer: Self-pay

## 2014-12-26 DIAGNOSIS — C50919 Malignant neoplasm of unspecified site of unspecified female breast: Secondary | ICD-10-CM

## 2015-01-08 ENCOUNTER — Ambulatory Visit (HOSPITAL_COMMUNITY): Payer: Medicare Other | Admitting: Hematology & Oncology

## 2015-01-08 ENCOUNTER — Other Ambulatory Visit (HOSPITAL_COMMUNITY): Payer: Medicare Other

## 2015-02-14 ENCOUNTER — Encounter (HOSPITAL_COMMUNITY): Payer: Self-pay | Admitting: Hematology & Oncology

## 2015-02-14 ENCOUNTER — Encounter (HOSPITAL_COMMUNITY): Payer: Medicare Other | Attending: Hematology & Oncology | Admitting: Hematology & Oncology

## 2015-02-14 ENCOUNTER — Encounter (HOSPITAL_BASED_OUTPATIENT_CLINIC_OR_DEPARTMENT_OTHER): Payer: Medicare Other

## 2015-02-14 VITALS — BP 140/64 | HR 56 | Temp 98.4°F | Resp 14 | Wt 119.2 lb

## 2015-02-14 DIAGNOSIS — C50919 Malignant neoplasm of unspecified site of unspecified female breast: Secondary | ICD-10-CM

## 2015-02-14 DIAGNOSIS — Z9882 Breast implant status: Secondary | ICD-10-CM | POA: Insufficient documentation

## 2015-02-14 DIAGNOSIS — Z853 Personal history of malignant neoplasm of breast: Secondary | ICD-10-CM | POA: Diagnosis not present

## 2015-02-14 DIAGNOSIS — Z9889 Other specified postprocedural states: Secondary | ICD-10-CM

## 2015-02-14 LAB — CBC WITH DIFFERENTIAL/PLATELET
BASOS PCT: 1 %
Basophils Absolute: 0 10*3/uL (ref 0.0–0.1)
EOS ABS: 0.2 10*3/uL (ref 0.0–0.7)
EOS PCT: 4 %
HCT: 37 % (ref 36.0–46.0)
Hemoglobin: 12.1 g/dL (ref 12.0–15.0)
LYMPHS ABS: 1.4 10*3/uL (ref 0.7–4.0)
Lymphocytes Relative: 29 %
MCH: 28.8 pg (ref 26.0–34.0)
MCHC: 32.7 g/dL (ref 30.0–36.0)
MCV: 88.1 fL (ref 78.0–100.0)
Monocytes Absolute: 0.6 10*3/uL (ref 0.1–1.0)
Monocytes Relative: 11 %
NEUTROS PCT: 55 %
Neutro Abs: 2.8 10*3/uL (ref 1.7–7.7)
PLATELETS: 253 10*3/uL (ref 150–400)
RBC: 4.2 MIL/uL (ref 3.87–5.11)
RDW: 13.9 % (ref 11.5–15.5)
WBC: 5 10*3/uL (ref 4.0–10.5)

## 2015-02-14 LAB — COMPREHENSIVE METABOLIC PANEL
ALBUMIN: 4.1 g/dL (ref 3.5–5.0)
ALT: 12 U/L — ABNORMAL LOW (ref 14–54)
ANION GAP: 7 (ref 5–15)
AST: 24 U/L (ref 15–41)
Alkaline Phosphatase: 72 U/L (ref 38–126)
BUN: 10 mg/dL (ref 6–20)
CALCIUM: 9 mg/dL (ref 8.9–10.3)
CHLORIDE: 105 mmol/L (ref 101–111)
CO2: 27 mmol/L (ref 22–32)
Creatinine, Ser: 0.82 mg/dL (ref 0.44–1.00)
GFR calc non Af Amer: >60 mL/min (ref >60)
Glucose, Bld: 103 mg/dL — ABNORMAL HIGH (ref 65–99)
POTASSIUM: 4 mmol/L (ref 3.5–5.1)
SODIUM: 139 mmol/L (ref 135–145)
Total Bilirubin: 0.6 mg/dL (ref 0.3–1.2)
Total Protein: 7.4 g/dL (ref 6.5–8.1)

## 2015-02-14 NOTE — Patient Instructions (Addendum)
[facility] at [facility] Discharge Instructions  RECOMMENDATIONS MADE BY THE CONSULTANT AND ANY TEST RESULTS WILL BE SENT TO YOUR REFERRING PHYSICIAN.  Exam and discussion by Dr. Whitney Muse Report any new lumps, bone pain, shortness of breath or other symptoms. If any issues with your labs we will call you. Will release you back to Dr. Nevada Crane for follow-up. Thank you for choosing [facility] at [facility] to provide your oncology and hematology care.  To afford each patient quality time with our provider, please arrive at least 15 minutes before your scheduled appointment time.    You need to re-schedule your appointment should you arrive 10 or more minutes late.  We strive to give you quality time with our providers, and arriving late affects you and other patients whose appointments are after yours.  Also, if you no show three or more times for appointments you may be dismissed from the clinic at the providers discretion.     Again, thank you for choosing [facility].  Our hope is that these requests will decrease the amount of time that you wait before being seen by our physicians.       _____________________________________________________________  Should you have questions after your visit to [facility], please contact our office at [phone] between the hours of 8:30 a.m. and 4:30 p.m.  Voicemails left after 4:30 p.m. will not be returned until the following business day.  For prescription refill requests, have your pharmacy contact our office.

## 2015-02-14 NOTE — Progress Notes (Signed)
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 Rx with anastrozole until April of 2009. 07/2002: Bilateral mastectomy with TRAM reconstruction.  CURRENT THERAPY: Observation  INTERVAL HISTORY: Destiny Norman 69 y.o. 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   Destiny Norman is here alone today. Destiny Norman is feeling well and stays active by walking and going hiking with her husband, who is also retired. Her appetite is good. Destiny Norman currently follows with Dr. Nevada Crane.  Destiny Norman did not experience any breast pain after reconstruction for 9-10 years, when Destiny Norman began feeling pain Destiny Norman saw Dr. Barnet Glasgow. Her breast pain has lessened since then. Destiny Norman no longer notices it like Destiny Norman did. Destiny Norman used to find herself holding her breasts due to the pain, but no longer does. Her reconstruction was done by Dr. Harlow Mares.  In reference to muscle weakness after surgery, Destiny Norman remarks that in the beginning Destiny Norman was very cautious. Even still Destiny Norman avoids certain activities, like kayaking.   Destiny Norman has not been to her gynecologist because they do not perform mammograms anymore. Destiny Norman still has her uterus and ovaries.  Destiny Norman is up to date on her colonoscopies.   MEDICAL HISTORY: Past Medical History  Diagnosis Date  . Breast cancer   . High cholesterol   . Vitamin D deficiency   . Hypertension      does not have a problem list on file.     is allergic to penicillins.  SURGICAL HISTORY: Past Surgical History  Procedure Laterality Date  . Mastectomy Right 2004  . Cesarean section    . Colonoscopy N/A 07/04/2014   Procedure: COLONOSCOPY;  Surgeon: Rogene Houston, Destiny Norman;  [facility];  Service: Endoscopy;  Laterality: N/A;  830  . Reconstruction breast w/ tram flap    . Radiology with anesthesia N/A 09/10/2014    Procedure: MRI ABDOMIN WITH AND WITHOUT;  Surgeon: Medication Radiologist, Destiny Norman;  Location: [facility];  Service: Radiology;  Laterality: N/A;    SOCIAL HISTORY: Social History   Social History  . Marital Status: Married    Spouse Name: N/A  . Number of Children: N/A  . Years of Education: N/A   Occupational History  . Not on file.   Social History Main Topics  . Smoking status: Never Smoker   . Smokeless tobacco: Never Used  . Alcohol Use: Yes     Comment: occasional glass of wine  . Drug Use: No  . Sexual Activity: Yes   Other Topics Concern  . Not on file   Social History Narrative    FAMILY HISTORY: Family History  Problem Relation Age of Onset  . Hypertension Mother   . Cancer Brother     Review of Systems  Constitutional: Negative for fever, chills, weight loss and malaise/fatigue.  HENT: Negative for congestion, hearing loss, nosebleeds, sore throat and tinnitus.   Eyes: Negative for blurred vision, double vision, pain and discharge.  Respiratory: Negative for cough, hemoptysis, sputum production, shortness of breath and wheezing.   Cardiovascular: Negative for chest pain, palpitations, claudication, leg swelling and PND.  Gastrointestinal: Negative for heartburn, nausea, vomiting, abdominal pain,  diarrhea, constipation, blood in stool and melena.  Genitourinary: Negative for dysuria, urgency, frequency and hematuria.  Musculoskeletal: Negative for myalgias, joint pain and falls.  Skin: Negative for itching and rash.  Neurological: Negative for dizziness, tingling, tremors, sensory change, speech change, focal weakness, seizures, loss of consciousness, weakness and headaches.  Endo/Heme/Allergies: Does not bruise/bleed easily.  Psychiatric/Behavioral:  Negative for depression, suicidal ideas, memory loss and substance abuse. The patient is not nervous/anxious and does not have insomnia.     PHYSICAL EXAMINATION   ECOG PERFORMANCE STATUS: 0 - Asymptomatic  Filed Vitals:   02/14/15 1006  BP: 140/64  Pulse: 56  Temp: 98.4 F (36.9 C)  Resp: 14    Physical Exam  Constitutional: Destiny Norman is oriented to person, place, and time and well-developed, well-nourished, and in no distress.  HENT:  Head: Normocephalic and atraumatic.  Nose: Nose normal.  Mouth/Throat: Oropharynx is clear and moist. No oropharyngeal exudate.  Eyes: Conjunctivae and EOM are normal. Pupils are equal, round, and reactive to light. Right eye exhibits no discharge. Left eye exhibits no discharge. No scleral icterus.  Neck: Normal range of motion. Neck supple. No tracheal deviation present. No thyromegaly present.  Cardiovascular: Normal rate, regular rhythm and normal heart sounds.  Exam reveals no gallop and no friction rub.   No murmur heard. Pulmonary/Chest: Effort normal and breath sounds normal. Destiny Norman has no wheezes. Destiny Norman has no rales.    Abdominal: Soft. Bowel sounds are normal. Destiny Norman exhibits no distension and no mass. There is no tenderness. There is no rebound and no guarding.  Musculoskeletal: Normal range of motion. Destiny Norman exhibits no edema.  Lymphadenopathy:    Destiny Norman has no cervical adenopathy.  Neurological: Destiny Norman is alert and oriented to person, place, and time. Destiny Norman has normal reflexes. No cranial nerve deficit. Gait normal. Coordination normal.  Skin: Skin is warm and dry. No rash noted.  Psychiatric: Mood, memory, affect and judgment normal.  Nursing note and vitals reviewed.   LABORATORY DATA: I have reviewed the labs below. CBC    Component Value Date/Time   WBC 5.0 02/14/2015 0955   RBC 4.20 02/14/2015 0955   HGB 12.1 02/14/2015 0955   HCT 37.0 02/14/2015 0955   PLT 253 02/14/2015 0955   MCV 88.1 02/14/2015 0955   MCH 28.8 02/14/2015 0955   MCHC  32.7 02/14/2015 0955   RDW 13.9 02/14/2015 0955   LYMPHSABS 1.4 02/14/2015 0955   MONOABS 0.6 02/14/2015 0955   EOSABS 0.2 02/14/2015 0955   BASOSABS 0.0 02/14/2015 0955   CMP  CMP     Component Value Date/Time   NA 139 02/14/2015 0955   K 4.0 02/14/2015 0955   CL 105 02/14/2015 0955   CO2 27 02/14/2015 0955   GLUCOSE 103* 02/14/2015 0955   BUN 10 02/14/2015 0955   CREATININE 0.82 02/14/2015 0955   CALCIUM 9.0 02/14/2015 0955   PROT 7.4 02/14/2015 0955   ALBUMIN 4.1 02/14/2015 0955   AST 24 02/14/2015 0955   ALT 12* 02/14/2015 0955   ALKPHOS 72 02/14/2015 0955   BILITOT 0.6 02/14/2015 0955   GFRNONAA >60 02/14/2015 0955   GFRAA >60 02/14/2015 0955     ASSESSMENT and THERAPY PLAN:  07/06/2002: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 Rx with anastrozole until April of 2009. 07/2002: Bilateral mastectomy  with TRAM reconstruction.   Destiny Norman is doing well with no evidence of recurrent disease. Destiny Norman is physically very active.  Destiny Norman would like to continue to follow with Dr. Nevada Crane only.  Destiny Norman notes Destiny Norman came today to meet me and get established should Destiny Norman need anything in the future.  I have encouraged her to follow-up with gynecology. Continue all well care including C-scope as indicated. Destiny Norman no longer requires mammograms and I discussed this with her in detail.  Destiny Norman will continue to follow closely with Dr. Nevada Crane. Destiny Norman will schedule with [place] as needed.  All questions were answered. The patient knows to call the clinic with any problems, questions or concerns. We can certainly see the patient much sooner if necessary.  This note was electronically signed.  This document serves as a record of services personally performed by Ancil Linsey, Destiny Norman. It  was created on her behalf by Arlyce Harman, a trained medical scribe. The creation of this record is based on the scribe's personal observations and the provider's statements to them. This document has been checked and approved by the attending provider.  I have reviewed the above documentation for accuracy and completeness, and I agree with the above.  Kelby Fam. Penland, Destiny Norman  02/14/2015

## 2015-02-14 NOTE — Progress Notes (Signed)
Labs drawn

## 2015-02-15 LAB — CEA: CEA: 0.7 ng/mL (ref 0.0–4.7)

## 2015-02-15 LAB — CANCER ANTIGEN 27.29: CA 27.29: 20.5 U/mL (ref 0.0–38.6)

## 2015-02-23 ENCOUNTER — Encounter (HOSPITAL_COMMUNITY): Payer: Self-pay | Admitting: Hematology & Oncology

## 2016-01-15 ENCOUNTER — Ambulatory Visit (INDEPENDENT_AMBULATORY_CARE_PROVIDER_SITE_OTHER): Payer: Medicare Other | Admitting: Otolaryngology

## 2016-01-15 DIAGNOSIS — H6983 Other specified disorders of Eustachian tube, bilateral: Secondary | ICD-10-CM

## 2016-01-15 DIAGNOSIS — H903 Sensorineural hearing loss, bilateral: Secondary | ICD-10-CM

## 2016-01-15 DIAGNOSIS — H6122 Impacted cerumen, left ear: Secondary | ICD-10-CM | POA: Diagnosis not present

## 2016-02-23 ENCOUNTER — Other Ambulatory Visit (HOSPITAL_COMMUNITY): Payer: Self-pay | Admitting: Internal Medicine

## 2016-02-23 DIAGNOSIS — Z780 Asymptomatic menopausal state: Secondary | ICD-10-CM

## 2016-02-27 ENCOUNTER — Other Ambulatory Visit (HOSPITAL_COMMUNITY): Payer: Medicare Other

## 2016-02-27 ENCOUNTER — Ambulatory Visit (HOSPITAL_COMMUNITY)
Admission: RE | Admit: 2016-02-27 | Discharge: 2016-02-27 | Disposition: A | Discharge status: Home / Self Care | Payer: Medicare Other | Source: Ambulatory Visit | Attending: Internal Medicine | Admitting: Internal Medicine

## 2016-02-27 DIAGNOSIS — Z780 Asymptomatic menopausal state: Secondary | ICD-10-CM

## 2016-02-27 DIAGNOSIS — M8589 Other specified disorders of bone density and structure, multiple sites: Secondary | ICD-10-CM | POA: Diagnosis not present

## 2017-01-10 ENCOUNTER — Ambulatory Visit (INDEPENDENT_AMBULATORY_CARE_PROVIDER_SITE_OTHER): Payer: Medicare Other | Admitting: Otolaryngology

## 2017-01-10 DIAGNOSIS — H903 Sensorineural hearing loss, bilateral: Secondary | ICD-10-CM | POA: Diagnosis not present

## 2017-01-10 DIAGNOSIS — H6123 Impacted cerumen, bilateral: Secondary | ICD-10-CM | POA: Diagnosis not present

## 2017-02-23 ENCOUNTER — Encounter (HOSPITAL_COMMUNITY): Payer: Self-pay | Admitting: Adult Health

## 2017-02-23 ENCOUNTER — Encounter (HOSPITAL_COMMUNITY): Payer: Medicare Other | Attending: Oncology | Admitting: Adult Health

## 2017-02-23 VITALS — BP 142/80 | HR 76 | Resp 16 | Ht 60.0 in | Wt 124.0 lb

## 2017-02-23 DIAGNOSIS — N898 Other specified noninflammatory disorders of vagina: Secondary | ICD-10-CM | POA: Diagnosis not present

## 2017-02-23 DIAGNOSIS — Z9013 Acquired absence of bilateral breasts and nipples: Secondary | ICD-10-CM

## 2017-02-23 DIAGNOSIS — C50911 Malignant neoplasm of unspecified site of right female breast: Secondary | ICD-10-CM

## 2017-02-23 DIAGNOSIS — Z9221 Personal history of antineoplastic chemotherapy: Secondary | ICD-10-CM | POA: Diagnosis not present

## 2017-02-23 DIAGNOSIS — Z170 Estrogen receptor positive status [ER+]: Secondary | ICD-10-CM | POA: Diagnosis not present

## 2017-02-23 DIAGNOSIS — Z853 Personal history of malignant neoplasm of breast: Secondary | ICD-10-CM | POA: Diagnosis not present

## 2017-02-23 NOTE — Patient Instructions (Signed)
[facility] Discharge Instructions  RECOMMENDATIONS MADE BY THE CONSULTANT AND ANY TEST RESULTS WILL BE SENT TO YOUR REFERRING PHYSICIAN.  You were seen today by Gretchen Dawson NP.   Thank you for choosing [facility] to provide your oncology and hematology care.  To afford each patient quality time with our provider, please arrive at least 15 minutes before your scheduled appointment time.    If you have a lab appointment with the Cancer Center please come in thru the  Main Entrance and check in at the main information desk  You need to re-schedule your appointment should you arrive 10 or more minutes late.  We strive to give you quality time with our providers, and arriving late affects you and other patients whose appointments are after yours.  Also, if you no show three or more times for appointments you may be dismissed from the clinic at the providers discretion.     Again, thank you for choosing [facility].  Our hope is that these requests will decrease the amount of time that you wait before being seen by our physicians.       _____________________________________________________________  Should you have questions after your visit to [facility], please contact our office at [phone] between the hours of 8:30 a.m. and 4:30 p.m.  Voicemails left after 4:30 p.m. will not be returned until the following business day.  For prescription refill requests, have your pharmacy contact our office.       Resources For Cancer Patients and their Caregivers ? American Cancer Society: Can assist with transportation, wigs, general needs, runs Look Good Feel Better.        1-888-227-6333 ? Cancer Care: Provides financial assistance, online support groups, medication/co-pay assistance.  1-800-813-HOPE (4673) ? [facility] Assists [place] cancer patients and  their families through emotional , educational and financial support.  336-427-4357 ? [place] DSS Where to apply for food stamps, Medicaid and utility assistance. 336-342-1394 ? [facility]: Transportation to medical appointments. 336-347-2287 ? Social Security Administration: May apply for disability if have a Stage IV cancer. 336-342-7796 1-800-772-1213 ? [place] Aging, Disability and Transit Services: Assists with nutrition, care and transit needs. 336-349-2343  Cancer Center Support Programs: @10RELATIVEDAYS@ > Cancer Support Group  2nd Tuesday of the month 1pm-2pm, Journey Room  > Creative Journey  3rd Tuesday of the month 1130am-1pm, Journey Room  > Look Good Feel Better  1st Wednesday of the month 10am-12 noon, Journey Room (Call American Cancer Society to register 1-800-395-5775)    

## 2017-02-23 NOTE — Progress Notes (Signed)
[name], [place] [address]   CLINIC:  Medical Oncology/Hematology  PCP:  Celene Squibb, MD [address] [place] [address] [phone]   REASON FOR VISIT:  Follow-up for History of (R) breast invasive lobular carcinoma; ER+/PR+/HER2-  CURRENT THERAPY: Observation     HISTORY OF PRESENT ILLNESS:  (From Dr. Donald Pore last note on 02/14/15)  Destiny Norman 72 y.o. 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   Ms. Lei is here alone today. She is feeling well and stays active by walking and going hiking with her husband, who is also retired. Her appetite is good. She currently follows with Dr. Nevada Crane.  She did not experience any breast pain after reconstruction for 9-10 years, when she began feeling pain she saw Dr. Barnet Glasgow. Her breast pain has lessened since then. She no longer notices it like she did. She used to find herself holding her breasts due to the pain, but no longer does. Her reconstruction was done by Dr. Harlow Mares.  In reference to muscle weakness after surgery, she remarks that in the beginning she was very cautious. Even still she avoids certain activities, like kayaking.     INTERVAL HISTORY:  Destiny Norman 71 y.o. female returns for routine follow-up for history of right breast cancer.    Overall, she tells me she has been feeling very well. Appetite and energy levels both 100%.  It has been ~2 years since her last visit here at the cancer center.  She has been following closely with her PCP, Dr. Nevada Crane, who recommended that she return to see [place] today since it had been 2 years.    Denies any changes in her breasts. Occasionally she has sharp/shooting breast pain, which she attributes to nerve damage from breast reconstruction several years ago.    Her only complaint today is vaginal dryness and dyspareunia.  She  uses coconut oil vaginally, but only intermittently. She saw her gynecologist last year and a pap smear was performed; she states that she is not sure she needs to continue having routine pelvic exams "because of my age."    Otherwise, she is largely without complaints today.     REVIEW OF SYSTEMS:  Review of Systems  Constitutional: Negative.  Negative for chills, fatigue and fever.  HENT:  Negative.  Negative for lump/mass and nosebleeds.   Eyes: Negative.   Respiratory: Negative.  Negative for cough and shortness of breath.   Cardiovascular: Negative.  Negative for chest pain and leg swelling.  Gastrointestinal: Negative.  Negative for abdominal pain, blood in stool, constipation, diarrhea, nausea and vomiting.  Endocrine: Negative.   Genitourinary: Negative for dysuria, hematuria and vaginal bleeding.        Vaginal dryness   Musculoskeletal: Negative.  Negative for arthralgias.  Skin: Negative.  Negative for rash.  Neurological: Negative.  Negative for dizziness and headaches.  Hematological: Negative.  Negative for adenopathy. Does not bruise/bleed easily.  Psychiatric/Behavioral: Negative.  Negative for depression and sleep disturbance. The patient is not nervous/anxious.      PAST MEDICAL/SURGICAL HISTORY:  Past Medical History:  Diagnosis Date  . Breast cancer ([place])   . High cholesterol   . Hypertension   . Vitamin D deficiency    Past Surgical History:  Procedure Laterality Date  . CESAREAN SECTION    . COLONOSCOPY N/A 07/04/2014  Procedure: COLONOSCOPY;  Surgeon: Rogene Houston, MD;  [facility];  Service: Endoscopy;  Laterality: N/A;  830  . MASTECTOMY Right 2004  . RADIOLOGY WITH ANESTHESIA N/A 09/10/2014   Procedure: MRI ABDOMIN WITH AND WITHOUT;  Surgeon: Medication Radiologist, MD;  Location: [facility];  Service: Radiology;  Laterality: N/A;  . RECONSTRUCTION BREAST W/ TRAM FLAP       SOCIAL HISTORY:  Social History   Social History  . Marital  status: Married    Spouse name: N/A  . Number of children: N/A  . Years of education: N/A   Occupational History  . Not on file.   Social History Main Topics  . Smoking status: Never Smoker  . Smokeless tobacco: Never Used  . Alcohol use Yes     Comment: occasional glass of wine  . Drug use: No  . Sexual activity: Yes   Other Topics Concern  . Not on file   Social History Narrative  . No narrative on file    FAMILY HISTORY:  Family History  Problem Relation Age of Onset  . Hypertension Mother   . Cancer Brother     CURRENT MEDICATIONS:  Outpatient Encounter Prescriptions as of 02/23/2017  Medication Sig  . ALPRAZolam (XANAX) 0.25 MG tablet Take 0.25 mg by mouth at bedtime as needed for anxiety. Takes  1/2 tablet occasionally  . Cholecalciferol (VITAMIN D3) 1000 UNITS CAPS Take 1,000 Units by mouth daily.  [name] lisinopril-hydrochlorothiazide (PRINZIDE,ZESTORETIC) 10-12.5 MG per tablet Take 1 tablet by mouth daily.  . pravastatin (PRAVACHOL) 40 MG tablet Take 40 mg by mouth daily.  . vitamin B-12 (CYANOCOBALAMIN) 1000 MCG tablet Take 1,000 mcg by mouth daily.   No facility-administered encounter medications on file as of 02/23/2017.     ALLERGIES:  Allergies  Allergen Reactions  . Penicillins Rash     PHYSICAL EXAM:  ECOG Performance status: 0 - Asymptomatic   Vitals:   02/23/17 1437  BP: (!) 142/80  Pulse: 76  Resp: 16  SpO2: 99%   Filed Weights   02/23/17 1437  Weight: 124 lb (56.2 kg)    Physical Exam  Constitutional: She is oriented to person, place, and time and well-developed, well-nourished, and in no distress.  HENT:  Head: Normocephalic.  Mouth/Throat: Oropharynx is clear and moist. No oropharyngeal exudate.  Eyes: Pupils are equal, round, and reactive to light. Conjunctivae are normal. No scleral icterus.  Neck: Normal range of motion. Neck supple.  Cardiovascular: Normal rate, regular rhythm and normal heart sounds.   Pulmonary/Chest: Effort  normal and breath sounds normal. No respiratory distress.    Abdominal: Soft. Bowel sounds are normal. There is no tenderness.  Musculoskeletal: Normal range of motion. She exhibits no edema.  Lymphadenopathy:    She has no cervical adenopathy.       Right: No supraclavicular adenopathy present.       Left: No supraclavicular adenopathy present.  Neurological: She is alert and oriented to person, place, and time. No cranial nerve deficit. Gait normal.  Skin: Skin is warm and dry. No rash noted.  Psychiatric: Mood, memory, affect and judgment normal.  Nursing note and vitals reviewed.    LABORATORY DATA:  I have reviewed the labs as listed.  CBC    Component Value Date/Time   WBC 5.0 02/14/2015 0955   RBC 4.20 02/14/2015 0955   HGB 12.1 02/14/2015 0955   HCT 37.0 02/14/2015 0955   PLT 253 02/14/2015 0955   MCV 88.1 02/14/2015  0955   MCH 28.8 02/14/2015 0955   MCHC 32.7 02/14/2015 0955   RDW 13.9 02/14/2015 0955   LYMPHSABS 1.4 02/14/2015 0955   MONOABS 0.6 02/14/2015 0955   EOSABS 0.2 02/14/2015 0955   BASOSABS 0.0 02/14/2015 0955   CMP Latest Ref Rng & Units 02/14/2015 01/04/2014  Glucose 65 - 99 mg/dL 103(H) 116(H)  BUN 6 - 20 mg/dL 10 16  Creatinine 0.44 - 1.00 mg/dL 0.82 0.83  Sodium 135 - 145 mmol/L 139 137  Potassium 3.5 - 5.1 mmol/L 4.0 4.3  Chloride 101 - 111 mmol/L 105 97  CO2 22 - 32 mmol/L 27 25  Calcium 8.9 - 10.3 mg/dL 9.0 9.7  Total Protein 6.5 - 8.1 g/dL 7.4 8.0  Total Bilirubin 0.3 - 1.2 mg/dL 0.6 0.3  Alkaline Phos 38 - 126 U/L 72 79  AST 15 - 41 U/L 24 18  ALT 14 - 54 U/L 12(L) 10    PENDING LABS:    DIAGNOSTIC IMAGING:    PATHOLOGY:     ASSESSMENT & PLAN:   History of (R) breast invasive lobular carcinoma; ER+/PR+/HER2-: -Diagnosed in 2004. Treated with bilateral mastectomy with TRAM reconstruction in 07/2002. Completed 5 years of anti-estrogen therapy in 08/2007.  -No role for mammogram given bilateral mastectomies.  -Clinical breast  exam performed today and benign.  -No labs necessary given remote history of breast cancer and regular follow-up with PCP who reportedly collects lab work. Shared with her that there are no specific tumor markers for early stage breast cancer in surveillance long-term.  Therefore, basic lab work can continue to be collected by her PCP as they deem clinically appropriate.  -Discussed her follow-up plan/wishes.  She has regular follow-up with her PCP, who would be alerted if she had new or concerning symptoms for malignancy, at which time we could certainly see her sooner if needed.  She prefers to "wait and see" if she would like to come back in 1 year or 2 years, which is reasonable given that she is now 14+ years out from her breast cancer diagnosis and treatment without evidence of recurrence. She agrees with this plan and prefers to call us for an appointment when she would like to be seen again.  Therefore, will leave her follow-up as "open-ended" for now.   Vaginal dryness:  -Likely secondary to menopause and previous ovarian suppression/aromatase inhibitor therapy x 5 years.  -Reportedly tried E-string vaginal estrogen in past with little improvement in symptoms.  -Encouraged her to increase use of coconut oil as vaginal moisturizer daily. She may also have some vaginal atrophy, likely secondary to menopause.  Recommended she try vaginal dilator once her vaginal dryness improves some.  Also discussed the Josph Macho Touch laser therapy, which unfortunately is not approved by insurance but is offered at [facility] gy[facility]ologic practice in [place]; briefly discussed the procedure with her today, which could provide her benefit if she wished to pursue this avenue.    Health maintenance/Wellness promotion:  -Reportedly up-to-date with pap smear, colonoscopy, skin cancer screening, vaccinations (she plans to get her flu shot with PCP), and other cancer screenings.  -Commended her efforts to  remain physically active; she has been using a pedometer/FitBit which motivates her to accomplish 10,000 steps per day for exercise.  She also tries to eat a healthy diet; encouraged her to keep up the great work.   -Denies any tobacco use; encouraged continued cessation. Endorses only occasional alcohol use, which is reasonable.  Dispo:  -Return to cancer center as needed.  She will consider if she wants to return in 1 or 2 years for subsequent follow-up visits here at the cancer center. No appts were made; she will call us when she would like to be seen.    All questions were answered to patient's stated satisfaction. Encouraged patient to call with any new concerns or questions before her next visit to the cancer center and we can certain see her sooner, if needed.    Plan of care discussed with Dr. Talbert Cage, who agrees with the above aforementioned.    Orders placed this encounter:  No orders of the defined types were placed in this encounter.     Mike Craze, NP [place] [phone]

## 2018-01-09 ENCOUNTER — Other Ambulatory Visit: Payer: Self-pay | Admitting: Internal Medicine

## 2018-01-09 DIAGNOSIS — Z780 Asymptomatic menopausal state: Secondary | ICD-10-CM

## 2018-01-16 ENCOUNTER — Ambulatory Visit (INDEPENDENT_AMBULATORY_CARE_PROVIDER_SITE_OTHER): Payer: Medicare Other | Admitting: Otolaryngology

## 2018-01-16 DIAGNOSIS — H903 Sensorineural hearing loss, bilateral: Secondary | ICD-10-CM | POA: Diagnosis not present

## 2018-03-16 ENCOUNTER — Other Ambulatory Visit (HOSPITAL_COMMUNITY): Payer: Self-pay | Admitting: Internal Medicine

## 2018-03-16 DIAGNOSIS — Z780 Asymptomatic menopausal state: Secondary | ICD-10-CM

## 2018-03-20 ENCOUNTER — Ambulatory Visit (HOSPITAL_COMMUNITY)
Admission: RE | Admit: 2018-03-20 | Discharge: 2018-03-20 | Disposition: A | Discharge status: Home / Self Care | Payer: Medicare Other | Source: Ambulatory Visit | Attending: Internal Medicine | Admitting: Internal Medicine

## 2018-03-20 ENCOUNTER — Inpatient Hospital Stay: Admission: RE | Admit: 2018-03-20 | Payer: Medicare Other | Source: Ambulatory Visit

## 2018-03-20 DIAGNOSIS — Z780 Asymptomatic menopausal state: Secondary | ICD-10-CM | POA: Insufficient documentation

## 2019-01-15 ENCOUNTER — Ambulatory Visit (INDEPENDENT_AMBULATORY_CARE_PROVIDER_SITE_OTHER): Payer: Medicare Other | Admitting: Otolaryngology

## 2019-01-15 DIAGNOSIS — H903 Sensorineural hearing loss, bilateral: Secondary | ICD-10-CM

## 2019-01-15 DIAGNOSIS — H6123 Impacted cerumen, bilateral: Secondary | ICD-10-CM

## 2019-07-06 DIAGNOSIS — R7301 Impaired fasting glucose: Secondary | ICD-10-CM | POA: Diagnosis not present

## 2019-07-06 DIAGNOSIS — Z853 Personal history of malignant neoplasm of breast: Secondary | ICD-10-CM | POA: Diagnosis not present

## 2019-07-06 DIAGNOSIS — Z0000 Encounter for general adult medical examination without abnormal findings: Secondary | ICD-10-CM | POA: Diagnosis not present

## 2019-07-06 DIAGNOSIS — G4700 Insomnia, unspecified: Secondary | ICD-10-CM | POA: Diagnosis not present

## 2019-07-06 DIAGNOSIS — N644 Mastodynia: Secondary | ICD-10-CM | POA: Diagnosis not present

## 2019-07-06 DIAGNOSIS — E782 Mixed hyperlipidemia: Secondary | ICD-10-CM | POA: Diagnosis not present

## 2019-07-06 DIAGNOSIS — M8580 Other specified disorders of bone density and structure, unspecified site: Secondary | ICD-10-CM | POA: Diagnosis not present

## 2019-07-06 DIAGNOSIS — R7303 Prediabetes: Secondary | ICD-10-CM | POA: Diagnosis not present

## 2019-07-06 DIAGNOSIS — I10 Essential (primary) hypertension: Secondary | ICD-10-CM | POA: Diagnosis not present

## 2019-07-10 DIAGNOSIS — E782 Mixed hyperlipidemia: Secondary | ICD-10-CM | POA: Diagnosis not present

## 2019-07-10 DIAGNOSIS — M8580 Other specified disorders of bone density and structure, unspecified site: Secondary | ICD-10-CM | POA: Diagnosis not present

## 2019-07-10 DIAGNOSIS — F5101 Primary insomnia: Secondary | ICD-10-CM | POA: Diagnosis not present

## 2019-07-10 DIAGNOSIS — N644 Mastodynia: Secondary | ICD-10-CM | POA: Diagnosis not present

## 2019-07-10 DIAGNOSIS — R7303 Prediabetes: Secondary | ICD-10-CM | POA: Diagnosis not present

## 2019-07-10 DIAGNOSIS — R7301 Impaired fasting glucose: Secondary | ICD-10-CM | POA: Diagnosis not present

## 2019-07-10 DIAGNOSIS — Z853 Personal history of malignant neoplasm of breast: Secondary | ICD-10-CM | POA: Diagnosis not present

## 2019-07-10 DIAGNOSIS — I10 Essential (primary) hypertension: Secondary | ICD-10-CM | POA: Diagnosis not present

## 2019-07-10 DIAGNOSIS — K219 Gastro-esophageal reflux disease without esophagitis: Secondary | ICD-10-CM | POA: Diagnosis not present

## 2019-08-20 ENCOUNTER — Encounter (INDEPENDENT_AMBULATORY_CARE_PROVIDER_SITE_OTHER): Payer: Self-pay | Admitting: *Deleted

## 2019-12-28 DIAGNOSIS — I10 Essential (primary) hypertension: Secondary | ICD-10-CM | POA: Diagnosis not present

## 2019-12-28 DIAGNOSIS — R7303 Prediabetes: Secondary | ICD-10-CM | POA: Diagnosis not present

## 2019-12-28 DIAGNOSIS — R7301 Impaired fasting glucose: Secondary | ICD-10-CM | POA: Diagnosis not present

## 2019-12-28 DIAGNOSIS — E782 Mixed hyperlipidemia: Secondary | ICD-10-CM | POA: Diagnosis not present

## 2020-01-01 DIAGNOSIS — E782 Mixed hyperlipidemia: Secondary | ICD-10-CM | POA: Diagnosis not present

## 2020-01-01 DIAGNOSIS — R7301 Impaired fasting glucose: Secondary | ICD-10-CM | POA: Diagnosis not present

## 2020-01-01 DIAGNOSIS — K219 Gastro-esophageal reflux disease without esophagitis: Secondary | ICD-10-CM | POA: Diagnosis not present

## 2020-01-01 DIAGNOSIS — M8580 Other specified disorders of bone density and structure, unspecified site: Secondary | ICD-10-CM | POA: Diagnosis not present

## 2020-01-01 DIAGNOSIS — I10 Essential (primary) hypertension: Secondary | ICD-10-CM | POA: Diagnosis not present

## 2020-01-01 DIAGNOSIS — Z853 Personal history of malignant neoplasm of breast: Secondary | ICD-10-CM | POA: Diagnosis not present

## 2020-01-01 DIAGNOSIS — N644 Mastodynia: Secondary | ICD-10-CM | POA: Diagnosis not present

## 2020-01-01 DIAGNOSIS — R7303 Prediabetes: Secondary | ICD-10-CM | POA: Diagnosis not present

## 2020-01-01 DIAGNOSIS — Z0001 Encounter for general adult medical examination with abnormal findings: Secondary | ICD-10-CM | POA: Diagnosis not present

## 2020-01-14 DIAGNOSIS — L570 Actinic keratosis: Secondary | ICD-10-CM | POA: Diagnosis not present

## 2020-01-14 DIAGNOSIS — D1801 Hemangioma of skin and subcutaneous tissue: Secondary | ICD-10-CM | POA: Diagnosis not present

## 2020-01-14 DIAGNOSIS — L820 Inflamed seborrheic keratosis: Secondary | ICD-10-CM | POA: Diagnosis not present

## 2020-01-14 DIAGNOSIS — L814 Other melanin hyperpigmentation: Secondary | ICD-10-CM | POA: Diagnosis not present

## 2020-03-10 DIAGNOSIS — Z23 Encounter for immunization: Secondary | ICD-10-CM | POA: Diagnosis not present

## 2020-03-11 DIAGNOSIS — N644 Mastodynia: Secondary | ICD-10-CM | POA: Diagnosis not present

## 2020-03-19 ENCOUNTER — Other Ambulatory Visit: Payer: Self-pay | Admitting: Internal Medicine

## 2020-03-19 DIAGNOSIS — N644 Mastodynia: Secondary | ICD-10-CM

## 2020-04-11 ENCOUNTER — Ambulatory Visit
Admission: RE | Admit: 2020-04-11 | Discharge: 2020-04-11 | Disposition: A | Discharge status: Home / Self Care | Payer: Medicare PPO | Source: Ambulatory Visit | Attending: Internal Medicine | Admitting: Internal Medicine

## 2020-04-11 ENCOUNTER — Other Ambulatory Visit: Payer: Self-pay

## 2020-04-11 ENCOUNTER — Other Ambulatory Visit: Payer: Self-pay | Admitting: Internal Medicine

## 2020-04-11 DIAGNOSIS — N644 Mastodynia: Secondary | ICD-10-CM

## 2020-04-11 DIAGNOSIS — N6489 Other specified disorders of breast: Secondary | ICD-10-CM | POA: Diagnosis not present

## 2020-07-02 DIAGNOSIS — R7303 Prediabetes: Secondary | ICD-10-CM | POA: Diagnosis not present

## 2020-07-02 DIAGNOSIS — G4700 Insomnia, unspecified: Secondary | ICD-10-CM | POA: Diagnosis not present

## 2020-07-02 DIAGNOSIS — R7301 Impaired fasting glucose: Secondary | ICD-10-CM | POA: Diagnosis not present

## 2020-07-02 DIAGNOSIS — Z0000 Encounter for general adult medical examination without abnormal findings: Secondary | ICD-10-CM | POA: Diagnosis not present

## 2020-07-02 DIAGNOSIS — E782 Mixed hyperlipidemia: Secondary | ICD-10-CM | POA: Diagnosis not present

## 2020-07-02 DIAGNOSIS — I10 Essential (primary) hypertension: Secondary | ICD-10-CM | POA: Diagnosis not present

## 2020-07-02 DIAGNOSIS — M8580 Other specified disorders of bone density and structure, unspecified site: Secondary | ICD-10-CM | POA: Diagnosis not present

## 2020-07-02 DIAGNOSIS — Z0001 Encounter for general adult medical examination with abnormal findings: Secondary | ICD-10-CM | POA: Diagnosis not present

## 2020-07-02 DIAGNOSIS — Z23 Encounter for immunization: Secondary | ICD-10-CM | POA: Diagnosis not present

## 2020-07-08 DIAGNOSIS — K219 Gastro-esophageal reflux disease without esophagitis: Secondary | ICD-10-CM | POA: Diagnosis not present

## 2020-07-08 DIAGNOSIS — M8580 Other specified disorders of bone density and structure, unspecified site: Secondary | ICD-10-CM | POA: Diagnosis not present

## 2020-07-08 DIAGNOSIS — I10 Essential (primary) hypertension: Secondary | ICD-10-CM | POA: Diagnosis not present

## 2020-07-08 DIAGNOSIS — R7303 Prediabetes: Secondary | ICD-10-CM | POA: Diagnosis not present

## 2020-07-08 DIAGNOSIS — N644 Mastodynia: Secondary | ICD-10-CM | POA: Diagnosis not present

## 2020-07-08 DIAGNOSIS — Z853 Personal history of malignant neoplasm of breast: Secondary | ICD-10-CM | POA: Diagnosis not present

## 2020-07-08 DIAGNOSIS — E782 Mixed hyperlipidemia: Secondary | ICD-10-CM | POA: Diagnosis not present

## 2020-07-08 DIAGNOSIS — F5101 Primary insomnia: Secondary | ICD-10-CM | POA: Diagnosis not present

## 2020-07-08 DIAGNOSIS — R7301 Impaired fasting glucose: Secondary | ICD-10-CM | POA: Diagnosis not present

## 2020-07-17 ENCOUNTER — Other Ambulatory Visit (HOSPITAL_COMMUNITY): Payer: Self-pay | Admitting: Internal Medicine

## 2020-07-17 DIAGNOSIS — Z13820 Encounter for screening for osteoporosis: Secondary | ICD-10-CM

## 2020-07-18 ENCOUNTER — Encounter (INDEPENDENT_AMBULATORY_CARE_PROVIDER_SITE_OTHER): Payer: Self-pay | Admitting: *Deleted

## 2020-07-24 ENCOUNTER — Ambulatory Visit (HOSPITAL_COMMUNITY)
Admission: RE | Admit: 2020-07-24 | Discharge: 2020-07-24 | Disposition: A | Discharge status: Home / Self Care | Payer: Medicare PPO | Source: Ambulatory Visit | Attending: Internal Medicine | Admitting: Internal Medicine

## 2020-07-24 ENCOUNTER — Other Ambulatory Visit: Payer: Self-pay

## 2020-07-24 DIAGNOSIS — Z853 Personal history of malignant neoplasm of breast: Secondary | ICD-10-CM | POA: Diagnosis not present

## 2020-07-24 DIAGNOSIS — M85852 Other specified disorders of bone density and structure, left thigh: Secondary | ICD-10-CM | POA: Diagnosis not present

## 2020-07-24 DIAGNOSIS — Z780 Asymptomatic menopausal state: Secondary | ICD-10-CM | POA: Diagnosis not present

## 2020-07-24 DIAGNOSIS — M8589 Other specified disorders of bone density and structure, multiple sites: Secondary | ICD-10-CM | POA: Diagnosis not present

## 2020-07-24 DIAGNOSIS — Z13820 Encounter for screening for osteoporosis: Secondary | ICD-10-CM | POA: Diagnosis not present

## 2020-09-08 DIAGNOSIS — R42 Dizziness and giddiness: Secondary | ICD-10-CM | POA: Diagnosis not present

## 2020-09-08 DIAGNOSIS — R7301 Impaired fasting glucose: Secondary | ICD-10-CM | POA: Diagnosis not present

## 2020-09-08 DIAGNOSIS — Z0001 Encounter for general adult medical examination with abnormal findings: Secondary | ICD-10-CM | POA: Diagnosis not present

## 2020-09-08 DIAGNOSIS — G4700 Insomnia, unspecified: Secondary | ICD-10-CM | POA: Diagnosis not present

## 2020-09-08 DIAGNOSIS — R7303 Prediabetes: Secondary | ICD-10-CM | POA: Diagnosis not present

## 2020-09-08 DIAGNOSIS — E782 Mixed hyperlipidemia: Secondary | ICD-10-CM | POA: Diagnosis not present

## 2020-09-08 DIAGNOSIS — M8580 Other specified disorders of bone density and structure, unspecified site: Secondary | ICD-10-CM | POA: Diagnosis not present

## 2020-09-08 DIAGNOSIS — Z0000 Encounter for general adult medical examination without abnormal findings: Secondary | ICD-10-CM | POA: Diagnosis not present

## 2020-09-08 DIAGNOSIS — Z23 Encounter for immunization: Secondary | ICD-10-CM | POA: Diagnosis not present

## 2020-09-08 DIAGNOSIS — I10 Essential (primary) hypertension: Secondary | ICD-10-CM | POA: Diagnosis not present

## 2020-09-15 ENCOUNTER — Other Ambulatory Visit (HOSPITAL_COMMUNITY): Payer: Self-pay | Admitting: Internal Medicine

## 2020-09-15 ENCOUNTER — Other Ambulatory Visit: Payer: Self-pay | Admitting: Internal Medicine

## 2020-09-15 ENCOUNTER — Other Ambulatory Visit: Payer: Self-pay

## 2020-09-15 ENCOUNTER — Ambulatory Visit (HOSPITAL_COMMUNITY)
Admission: RE | Admit: 2020-09-15 | Discharge: 2020-09-15 | Disposition: A | Discharge status: Home / Self Care | Payer: Medicare PPO | Source: Ambulatory Visit | Attending: Internal Medicine | Admitting: Internal Medicine

## 2020-09-15 DIAGNOSIS — I10 Essential (primary) hypertension: Secondary | ICD-10-CM | POA: Diagnosis not present

## 2020-09-15 DIAGNOSIS — G4486 Cervicogenic headache: Secondary | ICD-10-CM | POA: Insufficient documentation

## 2020-09-15 DIAGNOSIS — R42 Dizziness and giddiness: Secondary | ICD-10-CM | POA: Diagnosis not present

## 2020-09-15 DIAGNOSIS — R519 Headache, unspecified: Secondary | ICD-10-CM | POA: Diagnosis not present

## 2020-10-08 ENCOUNTER — Other Ambulatory Visit: Payer: Self-pay

## 2020-10-08 ENCOUNTER — Other Ambulatory Visit (HOSPITAL_COMMUNITY): Payer: Self-pay | Admitting: Family Medicine

## 2020-10-08 ENCOUNTER — Ambulatory Visit (HOSPITAL_COMMUNITY)
Admission: RE | Admit: 2020-10-08 | Discharge: 2020-10-08 | Disposition: A | Discharge status: Home / Self Care | Payer: Medicare PPO | Source: Ambulatory Visit | Attending: Family Medicine | Admitting: Family Medicine

## 2020-10-08 DIAGNOSIS — R519 Headache, unspecified: Secondary | ICD-10-CM

## 2020-10-08 DIAGNOSIS — M542 Cervicalgia: Secondary | ICD-10-CM | POA: Diagnosis not present

## 2020-10-08 DIAGNOSIS — I10 Essential (primary) hypertension: Secondary | ICD-10-CM | POA: Diagnosis not present

## 2020-10-14 ENCOUNTER — Ambulatory Visit (INDEPENDENT_AMBULATORY_CARE_PROVIDER_SITE_OTHER): Payer: Medicare PPO | Admitting: Internal Medicine

## 2020-10-14 ENCOUNTER — Other Ambulatory Visit: Payer: Self-pay

## 2020-10-14 ENCOUNTER — Encounter (INDEPENDENT_AMBULATORY_CARE_PROVIDER_SITE_OTHER): Payer: Self-pay | Admitting: Internal Medicine

## 2020-10-14 DIAGNOSIS — R1319 Other dysphagia: Secondary | ICD-10-CM

## 2020-10-14 MED ORDER — PANTOPRAZOLE SODIUM 40 MG PO TBEC: 40.0000 mg | DELAYED_RELEASE_TABLET | Freq: Every day | ORAL | 5 refills | Status: DC

## 2020-10-14 NOTE — Patient Instructions (Signed)
Esophagogastroduodenoscopy with esophageal dilation to be scheduled.  Take pantoprazole first dose today before evening meal and then take it every morning 30 minutes before breakfast.

## 2020-10-14 NOTE — Progress Notes (Signed)
Presenting complaint;  Epigastric pain vomiting and reflux symptoms.  History of present illness  Patient is 75 year old Caucasian female who is referred through courtesy of Dr. Delphina Cahill for GI evaluation.  Patient says for the last 3 months she has had episodes of epigastric pain regurgitation as well as vomiting which usually occurs while she is eating or after eating.  The symptoms are more or less sudden.  She feels as if food would stop moving to her esophagus.  She would wait for few minutes and would feel better.  Sometimes she will have to regurgitate food for relief.  She has changed her eating habits.  She is also chewing food food thoroughly.  She was given omeprazole which she took for 10 days but could not tell any difference.  She denies heartburn.  She says sometimes when she has these spells she just goes to bed and feels better after few minutes.  She denies hematemesis melena or rectal bleeding.  She says her appetite is not good.  She has lost 5 pounds recently. She has a history of osteoporosis and she was on Fosamax which she believes was discontinued about 10 years ago.  She does not recall having chest pain or dysphagia while she was on Fosamax. She gives history of low back pain.  This pain seems to occur after physical activity such as working in the yard.  She also complains of headache, numbness and tingling in the back of her neck.  Neck symptoms started 1 month ago after she did pruning of Hydrangeas.  She has been using heating pad which helps. She also requested that I examined rash over her anterior right shoulder which she noticed this morning.  No history of tick bite. Her bowels are regular.  She denies rectal bleeding.  Regarding her neck symptoms she had unenhanced head CT on 09/12/2020 which did not reveal any acute abnormalities and she had neck CT on 10/10/2020 which revealed mild cervical spondylosis  Current Medications: Outpatient Encounter Medications as of  10/14/2020  Medication Sig  . Cholecalciferol (VITAMIN D3) 1000 UNITS CAPS Take 1,000 Units by mouth daily.  . hydrochlorothiazide (HYDRODIURIL) 25 MG tablet Take 25 mg by mouth daily. Patient states that she will take at 10 am in the morning.  [name] lisinopril (ZESTRIL) 20 MG tablet Take 20 mg by mouth at bedtime. Patient states that she will take 10 pm nightly.  . vitamin B-12 (CYANOCOBALAMIN) 1000 MCG tablet Take 1,000 mcg by mouth daily.  . rosuvastatin (CRESTOR) 10 MG tablet Take 10 mg by mouth daily. (Patient not taking: Reported on 10/14/2020)  . [DISCONTINUED] ALPRAZolam (XANAX) 0.25 MG tablet Take 0.25 mg by mouth at bedtime as needed for anxiety. Takes  1/2 tablet occasionally (Patient not taking: Reported on 10/14/2020)  . [DISCONTINUED] lisinopril-hydrochlorothiazide (PRINZIDE,ZESTORETIC) 10-12.5 MG per tablet Take 1 tablet by mouth daily. (Patient not taking: Reported on 10/14/2020)  . [DISCONTINUED] pravastatin (PRAVACHOL) 40 MG tablet Take 40 mg by mouth daily. (Patient not taking: Reported on 10/14/2020)   No facility-administered encounter medications on file as of 10/14/2020.   Past medical history  Hypertension of about 10 years duration Hyperlipidemia diagnosed 8 years ago History of right breast carcinoma.  She had mastectomy in 2007 and reconstructive surgery a year later. History of vitamin D deficiency History of osteoporosis. Screening colonoscopy in in February 2016.  It was incomplete exam and followed by virtual colonoscopy. 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 C-section in 1983.  Allergies  Allergies  Allergen Reactions  . Penicillins Rash    Family history  Father had prostate cancer and he died at age 77. Mother was diagnosed with lung carcinoma at age 27 and died a year later. 1 brother died of nasopharyngeal carcinoma at age 25.  He was diagnosed in his early 33s.  She has a brother  age 51 who recently had surgery for prostate cancer. She has 2 sisters.  Sister had melanoma removed 15 years ago and now she has lymphedema to her extremity.  She is 75 years old.  Her other sister age 66 had CVA resulting in loss of vision in one eye.   Social history  Patient is married she has a son age 43 in good health. 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  She has never smoked cigarettes and does not drink alcohol.  She says she was virtually walking every day at least 10,000 steps until her neck symptoms started about a month ago.  Physical examination  Blood pressure (!) 154/78, pulse 63, temperature 98.9 F (37.2 C), temperature source Oral, height 5' (1.524 m), weight 121 lb 6.4 oz (55.1 kg). Patient is alert and in no acute distress. She is wearing a mask. Conjunctiva is pink. Sclera is nonicteric Oropharyngeal mucosa is normal. She has partial upper and lower dental plates. No neck masses or thyromegaly noted. Cardiac exam with regular rhythm normal S1 and S2. No murmur or gallop noted. Lungs are clear to auscultation. Abdomen is symmetrical soft and nontender with organomegaly or masses.  Left lobe of the liver is palpable in epigastric region but is nontender No LE edema or clubbing noted. She has macular rash over anterior aspect of left shoulder.  There is no definite insect bite mark.   Assessment:  #1.  Patient's symptoms are suggestive of esophageal food impaction which started 3 months ago.  No prior history of heartburn but she could be a silent reflux or.  It is also possible that she has esophageal stricture secondary to Fosamax which she took for few years.  #2.  Patient's diminished appetite and weight loss of 5 pounds possibly related to her headache and neck symptoms.  #3.  Patient is average risk for CRC.  She had incomplete colonoscopy followed by virtual colonoscopy in February 2016. No recommendations for repeat  exam at this time.  Recommendations  Pantoprazole 40 mg by mouth 30 minutes before breakfast daily but she can take first dose today before supper. Esophagogastroduodenoscopy with esophageal dilation under monitored anesthesia care and near future.

## 2020-10-14 NOTE — H&P (View-Only) (Signed)
Presenting complaint;  Epigastric pain vomiting and reflux symptoms.  History of present illness  Patient is 75 year old Caucasian female who is referred through courtesy of Dr. Delphina Cahill for GI evaluation.  Patient says for the last 3 months she has had episodes of epigastric pain regurgitation as well as vomiting which usually occurs while she is eating or after eating.  The symptoms are more or less sudden.  She feels as if food would stop moving to her esophagus.  She would wait for few minutes and would feel better.  Sometimes she will have to regurgitate food for relief.  She has changed her eating habits.  She is also chewing food food thoroughly.  She was given omeprazole which she took for 10 days but could not tell any difference.  She denies heartburn.  She says sometimes when she has these spells she just goes to bed and feels better after few minutes.  She denies hematemesis melena or rectal bleeding.  She says her appetite is not good.  She has lost 5 pounds recently. She has a history of osteoporosis and she was on Fosamax which she believes was discontinued about 10 years ago.  She does not recall having chest pain or dysphagia while she was on Fosamax. She gives history of low back pain.  This pain seems to occur after physical activity such as working in the yard.  She also complains of headache, numbness and tingling in the back of her neck.  Neck symptoms started 1 month ago after she did pruning of Hydrangeas.  She has been using heating pad which helps. She also requested that I examined rash over her anterior right shoulder which she noticed this morning.  No history of tick bite. Her bowels are regular.  She denies rectal bleeding.  Regarding her neck symptoms she had unenhanced head CT on 09/12/2020 which did not reveal any acute abnormalities and she had neck CT on 10/10/2020 which revealed mild cervical spondylosis  Current Medications: Outpatient Encounter Medications as of  10/14/2020  Medication Sig  . Cholecalciferol (VITAMIN D3) 1000 UNITS CAPS Take 1,000 Units by mouth daily.  . hydrochlorothiazide (HYDRODIURIL) 25 MG tablet Take 25 mg by mouth daily. Patient states that she will take at 10 am in the morning.  [name] lisinopril (ZESTRIL) 20 MG tablet Take 20 mg by mouth at bedtime. Patient states that she will take 10 pm nightly.  . vitamin B-12 (CYANOCOBALAMIN) 1000 MCG tablet Take 1,000 mcg by mouth daily.  . rosuvastatin (CRESTOR) 10 MG tablet Take 10 mg by mouth daily. (Patient not taking: Reported on 10/14/2020)  . [DISCONTINUED] ALPRAZolam (XANAX) 0.25 MG tablet Take 0.25 mg by mouth at bedtime as needed for anxiety. Takes  1/2 tablet occasionally (Patient not taking: Reported on 10/14/2020)  . [DISCONTINUED] lisinopril-hydrochlorothiazide (PRINZIDE,ZESTORETIC) 10-12.5 MG per tablet Take 1 tablet by mouth daily. (Patient not taking: Reported on 10/14/2020)  . [DISCONTINUED] pravastatin (PRAVACHOL) 40 MG tablet Take 40 mg by mouth daily. (Patient not taking: Reported on 10/14/2020)   No facility-administered encounter medications on file as of 10/14/2020.   Past medical history  Hypertension of about 10 years duration Hyperlipidemia diagnosed 8 years ago History of right breast carcinoma.  She had mastectomy in 2007 and reconstructive surgery a year later. History of vitamin D deficiency History of osteoporosis. Screening colonoscopy in in February 2016.  It was incomplete exam and followed by virtual colonoscopy. 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 C-section in 1983.  Allergies  Allergies  Allergen Reactions  . Penicillins Rash    Family history  Father had prostate cancer and he died at age 33. Mother was diagnosed with lung carcinoma at age 72 and died a year later. 1 brother died of nasopharyngeal carcinoma at age 70.  He was diagnosed in his early 101s.  She has a brother  age 13 who recently had surgery for prostate cancer. She has 2 sisters.  Sister had melanoma removed 15 years ago and now she has lymphedema to her extremity.  She is 75 years old.  Her other sister age 70 had CVA resulting in loss of vision in one eye.   Social history  Patient is married she has a son age 35 in good health. 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  She has never smoked cigarettes and does not drink alcohol.  She says she was virtually walking every day at least 10,000 steps until her neck symptoms started about a month ago.  Physical examination  Blood pressure (!) 154/78, pulse 63, temperature 98.9 F (37.2 C), temperature source Oral, height 5' (1.524 m), weight 121 lb 6.4 oz (55.1 kg). Patient is alert and in no acute distress. She is wearing a mask. Conjunctiva is pink. Sclera is nonicteric Oropharyngeal mucosa is normal. She has partial upper and lower dental plates. No neck masses or thyromegaly noted. Cardiac exam with regular rhythm normal S1 and S2. No murmur or gallop noted. Lungs are clear to auscultation. Abdomen is symmetrical soft and nontender with organomegaly or masses.  Left lobe of the liver is palpable in epigastric region but is nontender No LE edema or clubbing noted. She has macular rash over anterior aspect of left shoulder.  There is no definite insect bite mark.   Assessment:  #1.  Patient's symptoms are suggestive of esophageal food impaction which started 3 months ago.  No prior history of heartburn but she could be a silent reflux or.  It is also possible that she has esophageal stricture secondary to Fosamax which she took for few years.  #2.  Patient's diminished appetite and weight loss of 5 pounds possibly related to her headache and neck symptoms.  #3.  Patient is average risk for CRC.  She had incomplete colonoscopy followed by virtual colonoscopy in February 2016. No recommendations for repeat  exam at this time.  Recommendations  Pantoprazole 40 mg by mouth 30 minutes before breakfast daily but she can take first dose today before supper. Esophagogastroduodenoscopy with esophageal dilation under monitored anesthesia care and near future.

## 2020-10-22 ENCOUNTER — Other Ambulatory Visit (INDEPENDENT_AMBULATORY_CARE_PROVIDER_SITE_OTHER): Payer: Self-pay

## 2020-10-22 DIAGNOSIS — R1319 Other dysphagia: Secondary | ICD-10-CM

## 2020-10-23 ENCOUNTER — Encounter (INDEPENDENT_AMBULATORY_CARE_PROVIDER_SITE_OTHER): Payer: Self-pay

## 2020-10-23 ENCOUNTER — Other Ambulatory Visit (INDEPENDENT_AMBULATORY_CARE_PROVIDER_SITE_OTHER): Payer: Self-pay

## 2020-10-28 NOTE — Patient Instructions (Signed)
Destiny Norman  10/28/2020     @PREFPERIOPPHARMACY @   Your procedure is scheduled on  11/05/2020.   Report to [facility] at  1210  P.M.   Call this number if you have problems the morning of surgery:  [phone]   Remember:  Follow the diet instructions given to you by the office.                       Take these medicines the morning of surgery with A SIP OF WATER  Gabapentin, protonix.     Please brush your teeth.  Do not wear jewelry, make-up or nail polish.  Do not wear lotions, powders, or perfumes, or deodorant.  Do not shave 48 hours prior to surgery.  Men may shave face and neck.  Do not bring valuables to the hospital.  [facility] is not responsible for any belongings or valuables.  Contacts, dentures or bridgework may not be worn into surgery.  Leave your suitcase in the car.  After surgery it may be brought to your room.  For patients admitted to the hospital, discharge time will be determined by your treatment team.  Patients discharged the day of surgery will not be allowed to drive home and must have someone with them for 24 hours.    Special instructions:  DO NOT smoke tobacco or vape for 24 hours before your surgery.  Please read over the following fact sheets that you were given. Anesthesia Post-op Instructions and Care and Recovery After Surgery       Upper Endoscopy, Adult, Care After This sheet gives you information about how to care for yourself after your procedure. Your health care provider may also give you more specific instructions. If you have problems or questions, contact your health care provider. What can I expect after the procedure? After the procedure, it is common to have:  A sore throat.  Mild stomach pain or discomfort.  Bloating.  Nausea. Follow these instructions at home:  Follow instructions from your health care provider about what to eat or drink after your procedure.  Return to your normal  activities as told by your health care provider. Ask your health care provider what activities are safe for you.  Take over-the-counter and prescription medicines only as told by your health care provider.  If you were given a sedative during the procedure, it can affect you for several hours. Do not drive or operate machinery until your health care provider says that it is safe.  Keep all follow-up visits as told by your health care provider. This is important.   Contact a health care provider if you have:  A sore throat that lasts longer than one day.  Trouble swallowing. Get help right away if:  You vomit blood or your vomit looks like coffee grounds.  You have: ? A fever. ? Bloody, black, or tarry stools. ? A severe sore throat or you cannot swallow. ? Difficulty breathing. ? Severe pain in your chest or abdomen. Summary  After the procedure, it is common to have a sore throat, mild stomach discomfort, bloating, and nausea.  If you were given a sedative during the procedure, it can affect you for several hours. Do not drive or operate machinery until your health care provider says that it is safe.  Follow instructions from your health care provider about what to eat or drink after your procedure.  Return to  your normal activities as told by your health care provider. This information is not intended to replace advice given to you by your health care provider. Make sure you discuss any questions you have with your health care provider. Document Revised: 05/08/2019 Document Reviewed: 10/10/2017 Elsevier Patient Education  2021 [place].  https://www.asge.org/home/for-patients/patient-information/understanding-eso-dilation-updated">  Esophageal Dilatation Esophageal dilatation, also called esophageal dilation, is a procedure to widen or open a blocked or narrowed part of the esophagus. The esophagus is the part of the body that moves food and liquid from the mouth to the  stomach. You may need this procedure if:  You have a buildup of scar tissue in your esophagus that makes it difficult, painful, or impossible to swallow. This can be caused by gastroesophageal reflux disease (GERD).  You have cancer of the esophagus.  There is a problem with how food moves through your esophagus. In some cases, you may need this procedure repeated at a later time to dilate the esophagus gradually. Tell a health care provider about:  Any allergies you have.  All medicines you are taking, including vitamins, herbs, eye drops, creams, and over-the-counter medicines.  Any problems you or family members have had with anesthetic medicines.  Any blood disorders you have.  Any surgeries you have had.  Any medical conditions you have.  Any antibiotic medicines you are required to take before dental procedures.  Whether you are pregnant or may be pregnant. What are the risks? Generally, this is a safe procedure. However, problems may occur, including:  Bleeding due to a tear in the lining of the esophagus.  A hole, or perforation, in the esophagus. What happens before the procedure?  Ask your health care provider about: ? Changing or stopping your regular medicines. This is especially important if you are taking diabetes medicines or blood thinners. ? Taking medicines such as aspirin and ibuprofen. These medicines can thin your blood. Do not take these medicines unless your health care provider tells you to take them. ? Taking over-the-counter medicines, vitamins, herbs, and supplements.  Follow instructions from your health care provider about eating or drinking restrictions.  Plan to have a responsible adult take you home from the hospital or clinic.  Plan to have a responsible adult care for you for the time you are told after you leave the hospital or clinic. This is important. What happens during the procedure?  You may be given a medicine to help you relax  (sedative).  A numbing medicine may be sprayed into the back of your throat, or you may gargle the medicine.  Your health care provider may perform the dilatation using various surgical instruments, such as: ? Simple dilators. This instrument is carefully placed in the esophagus to stretch it. ? Guided wire bougies. This involves using an endoscope to insert a wire into the esophagus. A dilator is passed over this wire to enlarge the esophagus. Then the wire is removed. ? Balloon dilators. An endoscope with a small balloon is inserted into the esophagus. The balloon is inflated to stretch the esophagus and open it up. The procedure may vary among health care providers and hospitals. What can I expect after the procedure?  Your blood pressure, heart rate, breathing rate, and blood oxygen level will be monitored until you leave the hospital or clinic.  Your throat may feel slightly sore and numb. This will get better over time.  You will not be allowed to eat or drink until your throat is no longer numb.  When you are able to drink, urinate, and sit on the edge of the bed without nausea or dizziness, you may be able to return home. Follow these instructions at home:  Take over-the-counter and prescription medicines only as told by your health care provider.  If you were given a sedative during the procedure, it can affect you for several hours. Do not drive or operate machinery until your health care provider says that it is safe.  Plan to have a responsible adult care for you for the time you are told. This is important.  Follow instructions from your health care provider about any eating or drinking restrictions.  Do not use any products that contain nicotine or tobacco, such as cigarettes, e-cigarettes, and chewing tobacco. If you need help quitting, ask your health care provider.  Keep all follow-up visits. This is important. Contact a health care provider if:  You have a  fever.  You have pain that is not relieved by medicine. Get help right away if:  You have chest pain.  You have trouble breathing.  You have trouble swallowing.  You vomit blood.  You have black, tarry, or bloody stools. These symptoms may represent a serious problem that is an emergency. Do not wait to see if the symptoms will go away. Get medical help right away. Call your local emergency services (911 in the [place] Do not drive yourself to the hospital. Summary  Esophageal dilatation, also called esophageal dilation, is a procedure to widen or open a blocked or narrowed part of the esophagus.  Plan to have a responsible adult take you home from the hospital or clinic.  For this procedure, a numbing medicine may be sprayed into the back of your throat, or you may gargle the medicine.  Do not drive or operate machinery until your health care provider says that it is safe. This information is not intended to replace advice given to you by your health care provider. Make sure you discuss any questions you have with your health care provider. Document Revised: 09/26/2019 Document Reviewed: 09/26/2019 Elsevier Patient Education  2021 [place] After This sheet gives you information about how to care for yourself after your procedure. Your health care provider may also give you more specific instructions. If you have problems or questions, contact your health care provider. What can I expect after the procedure? After the procedure, it is common to have:  Tiredness.  Forgetfulness about what happened after the procedure.  Impaired judgment for important decisions.  Nausea or vomiting.  Some difficulty with balance. Follow these instructions at home: For the time period you were told by your health care provider:  Rest as needed.  Do not participate in activities where you could fall or become injured.  Do not drive or use  machinery.  Do not drink alcohol.  Do not take sleeping pills or medicines that cause drowsiness.  Do not make important decisions or sign legal documents.  Do not take care of children on your own.      Eating and drinking  Follow the diet that is recommended by your health care provider.  Drink enough fluid to keep your urine pale yellow.  If you vomit: ? Drink water, juice, or soup when you can drink without vomiting. ? Make sure you have little or no nausea before eating solid foods. General instructions  Have a responsible adult stay with you for the time you are told. It is important  to have someone help care for you until you are awake and alert.  Take over-the-counter and prescription medicines only as told by your health care provider.  If you have sleep apnea, surgery and certain medicines can increase your risk for breathing problems. Follow instructions from your health care provider about wearing your sleep device: ? Anytime you are sleeping, including during daytime naps. ? While taking prescription pain medicines, sleeping medicines, or medicines that make you drowsy.  Avoid smoking.  Keep all follow-up visits as told by your health care provider. This is important. Contact a health care provider if:  You keep feeling nauseous or you keep vomiting.  You feel light-headed.  You are still sleepy or having trouble with balance after 24 hours.  You develop a rash.  You have a fever.  You have redness or swelling around the IV site. Get help right away if:  You have trouble breathing.  You have new-onset confusion at home. Summary  For several hours after your procedure, you may feel tired. You may also be forgetful and have poor judgment.  Have a responsible adult stay with you for the time you are told. It is important to have someone help care for you until you are awake and alert.  Rest as told. Do not drive or operate machinery. Do not drink  alcohol or take sleeping pills.  Get help right away if you have trouble breathing, or if you suddenly become confused. This information is not intended to replace advice given to you by your health care provider. Make sure you discuss any questions you have with your health care provider. Document Revised: 01/24/2020 Document Reviewed: 04/12/2019 Elsevier Patient Education  2021 [employer].

## 2020-10-30 ENCOUNTER — Encounter (HOSPITAL_COMMUNITY)
Admission: RE | Admit: 2020-10-30 | Discharge: 2020-10-30 | Disposition: A | Discharge status: Home / Self Care | Payer: Medicare PPO | Source: Ambulatory Visit | Attending: Internal Medicine | Admitting: Internal Medicine

## 2020-10-30 ENCOUNTER — Encounter (HOSPITAL_COMMUNITY): Payer: Self-pay

## 2020-10-30 ENCOUNTER — Other Ambulatory Visit: Payer: Self-pay

## 2020-10-30 DIAGNOSIS — Z01818 Encounter for other preprocedural examination: Secondary | ICD-10-CM | POA: Insufficient documentation

## 2020-10-30 DIAGNOSIS — R1319 Other dysphagia: Secondary | ICD-10-CM

## 2020-10-30 LAB — BASIC METABOLIC PANEL
Anion gap: 7 (ref 5–15)
BUN: 11 mg/dL (ref 8–23)
CO2: 26 mmol/L (ref 22–32)
Calcium: 9 mg/dL (ref 8.9–10.3)
Chloride: 101 mmol/L (ref 98–111)
Creatinine, Ser: 0.84 mg/dL (ref 0.44–1.00)
GFR, Estimated: >60 mL/min (ref >60)
Glucose, Bld: 108 mg/dL — ABNORMAL HIGH (ref 70–99)
Potassium: 3.9 mmol/L (ref 3.5–5.1)
Sodium: 134 mmol/L — ABNORMAL LOW (ref 135–145)

## 2020-11-05 ENCOUNTER — Ambulatory Visit (HOSPITAL_COMMUNITY)
Admission: RE | Admit: 2020-11-05 | Discharge: 2020-11-05 | Disposition: A | Discharge status: Home / Self Care | Payer: Medicare PPO | Attending: Internal Medicine | Admitting: Internal Medicine

## 2020-11-05 ENCOUNTER — Encounter (HOSPITAL_COMMUNITY)
Admission: RE | Disposition: A | Discharge status: Home / Self Care | Payer: Self-pay | Source: Home / Self Care | Attending: Internal Medicine

## 2020-11-05 ENCOUNTER — Ambulatory Visit (HOSPITAL_COMMUNITY): Payer: Medicare PPO | Admitting: Certified Registered"

## 2020-11-05 ENCOUNTER — Encounter (HOSPITAL_COMMUNITY): Payer: Self-pay | Admitting: Internal Medicine

## 2020-11-05 DIAGNOSIS — K449 Diaphragmatic hernia without obstruction or gangrene: Secondary | ICD-10-CM | POA: Diagnosis not present

## 2020-11-05 DIAGNOSIS — R1319 Other dysphagia: Secondary | ICD-10-CM | POA: Diagnosis not present

## 2020-11-05 DIAGNOSIS — Z79899 Other long term (current) drug therapy: Secondary | ICD-10-CM | POA: Insufficient documentation

## 2020-11-05 DIAGNOSIS — R1314 Dysphagia, pharyngoesophageal phase: Secondary | ICD-10-CM | POA: Diagnosis not present

## 2020-11-05 DIAGNOSIS — K222 Esophageal obstruction: Secondary | ICD-10-CM | POA: Diagnosis not present

## 2020-11-05 DIAGNOSIS — Z880 Allergy status to penicillin: Secondary | ICD-10-CM | POA: Diagnosis not present

## 2020-11-05 DIAGNOSIS — Z853 Personal history of malignant neoplasm of breast: Secondary | ICD-10-CM | POA: Diagnosis not present

## 2020-11-05 DIAGNOSIS — M810 Age-related osteoporosis without current pathological fracture: Secondary | ICD-10-CM | POA: Diagnosis not present

## 2020-11-05 HISTORY — PX: ESOPHAGOGASTRODUODENOSCOPY (EGD) WITH PROPOFOL: SHX5813

## 2020-11-05 HISTORY — PX: ESOPHAGEAL DILATION: SHX303

## 2020-11-05 SURGERY — ESOPHAGOGASTRODUODENOSCOPY (EGD) WITH PROPOFOL
Anesthesia: General

## 2020-11-05 MED ORDER — LACTATED RINGERS IV SOLN: INTRAVENOUS | Status: DC | PRN

## 2020-11-05 MED ORDER — PROPOFOL 500 MG/50ML IV EMUL
INTRAVENOUS | Status: DC | PRN
  Administered 2020-11-05: 150 ug/kg/min via INTRAVENOUS

## 2020-11-05 MED ORDER — PROPOFOL 10 MG/ML IV BOLUS
INTRAVENOUS | Status: DC | PRN
  Administered 2020-11-05: 40 mg via INTRAVENOUS
  Administered 2020-11-05: 100 mg via INTRAVENOUS
  Administered 2020-11-05: 30 mg via INTRAVENOUS

## 2020-11-05 MED ORDER — LIDOCAINE HCL (CARDIAC) PF 100 MG/5ML IV SOSY
PREFILLED_SYRINGE | INTRAVENOUS | Status: DC | PRN
  Administered 2020-11-05: 50 mg via INTRAVENOUS

## 2020-11-05 MED ORDER — HYDROCODONE-ACETAMINOPHEN 7.5-325 MG/15ML PO SOLN: 10.0000 mL | Freq: Four times a day (QID) | ORAL | 0 refills | Status: DC | PRN

## 2020-11-05 NOTE — Discharge Instructions (Addendum)
No aspirin or NSAIDs for 3 days. Resume usual medications as before. Hycodan liquid 2 teaspoonful by mouth up to 4 times a day as needed for pain. Soft foods for 2 days. No driving for 24 hours. Please call office with progress report in 1 week.

## 2020-11-05 NOTE — Anesthesia Procedure Notes (Signed)
Date/Time: 11/05/2020 12:25 PM Performed by: Orlie Dakin, CRNA Pre-anesthesia Checklist: Patient identified, Emergency Drugs available, Suction available and Patient being monitored Patient Re-evaluated:Patient Re-evaluated prior to induction Oxygen Delivery Method: Nasal cannula Induction Type: IV induction Placement Confirmation: positive ETCO2

## 2020-11-05 NOTE — Anesthesia Postprocedure Evaluation (Signed)
Anesthesia Post Note  Patient: Destiny Norman  Procedure(s) Performed: ESOPHAGOGASTRODUODENOSCOPY (EGD) WITH PROPOFOL ESOPHAGEAL DILATION  Patient location during evaluation: Phase II Anesthesia Type: General Level of consciousness: awake Pain management: pain level controlled Vital Signs Assessment: post-procedure vital signs reviewed and stable Respiratory status: spontaneous breathing and respiratory function stable Cardiovascular status: blood pressure returned to baseline and stable Postop Assessment: no headache and no apparent nausea or vomiting Anesthetic complications: no Comments: Late entry   No notable events documented.   Last Vitals:  Vitals:   11/05/20 1245 11/05/20 1256  BP: (!) 126/53 138/78  Pulse: 65 (!) 58  Resp: (!) 22 18  Temp:    SpO2: 96% 98%    Last Pain:  Vitals:   11/05/20 1256  TempSrc:   PainSc: 0-No pain                 Louann Sjogren

## 2020-11-05 NOTE — Transfer of Care (Signed)
Immediate Anesthesia Transfer of Care Note  Patient: Destiny Norman  Procedure(s) Performed: ESOPHAGOGASTRODUODENOSCOPY (EGD) WITH PROPOFOL ESOPHAGEAL DILATION  Patient Location: PACU  Anesthesia Type:General  Level of Consciousness: drowsy  Airway & Oxygen Therapy: Patient Spontanous Breathing  Post-op Assessment: Report given to RN and Post -op Vital signs reviewed and stable  Post vital signs: Reviewed and stable  Last Vitals:  Vitals Value Taken Time  BP    Temp    Pulse 70 11/05/20 1240  Resp 15 11/05/20 1240  SpO2 96 % 11/05/20 1240  Vitals shown include unvalidated device data.  Last Pain:  Vitals:   11/05/20 1219  TempSrc:   PainSc: 0-No pain      Patients Stated Pain Goal: 5 (91/98/02 2179)  Complications: No notable events documented.

## 2020-11-05 NOTE — Interval H&P Note (Signed)
Patient says she is doing better.  She is not having heartburn.  She had an episode of dysphagia while she was eating broccoli soup 1 day and she also had difficulty with apple. Examination is unchanged.  History and Physical Interval Note:  11/05/2020 12:04 PM  [place]  has presented today for surgery, with the diagnosis of Dysphagia Poss Esophageal Stricture.  The various methods of treatment have been discussed with the patient and family. After consideration of risks, benefits and other options for treatment, the patient has consented to  Procedure(s) with comments: ESOPHAGOGASTRODUODENOSCOPY (EGD) WITH PROPOFOL (N/A) - 2:15 ESOPHAGEAL DILATION (N/A) as a surgical intervention.  The patient's history has been reviewed, patient examined, no change in status, stable for surgery.  I have reviewed the patient's chart and labs.  Questions were answered to the patient's satisfaction.     [employer]

## 2020-11-05 NOTE — Anesthesia Preprocedure Evaluation (Signed)
Anesthesia Evaluation  Patient identified by MRN, date of birth, ID band Patient awake    Reviewed: Allergy & Precautions, H&P , NPO status , Patient's Chart, lab work & pertinent test results, reviewed documented beta blocker date and time   Airway Mallampati: II  TM Distance: >3 FB Neck ROM: full    Dental no notable dental hx.    Pulmonary neg pulmonary ROS,    Pulmonary exam normal breath sounds clear to auscultation       Cardiovascular Exercise Tolerance: Good hypertension, negative cardio ROS   Rhythm:regular Rate:Normal     Neuro/Psych negative neurological ROS  negative psych ROS   GI/Hepatic negative GI ROS, Neg liver ROS,   Endo/Other  negative endocrine ROS  Renal/GU negative Renal ROS  negative genitourinary   Musculoskeletal negative musculoskeletal ROS (+)   Abdominal   Peds negative pediatric ROS (+)  Hematology negative hematology ROS (+)   Anesthesia Other Findings   Reproductive/Obstetrics negative OB ROS                             Anesthesia Physical Anesthesia Plan  ASA: 2  Anesthesia Plan: General   Post-op Pain Management:    Induction:   PONV Risk Score and Plan: Propofol infusion  Airway Management Planned:   Additional Equipment:   Intra-op Plan:   Post-operative Plan:   Informed Consent: I have reviewed the patients History and Physical, chart, labs and discussed the procedure including the risks, benefits and alternatives for the proposed anesthesia with the patient or authorized representative who has indicated his/her understanding and acceptance.     Dental Advisory Given  Plan Discussed with: CRNA  Anesthesia Plan Comments:         Anesthesia Quick Evaluation

## 2020-11-05 NOTE — Op Note (Signed)
[facility] Patient Name: Destiny Norman Procedure Date: 11/05/2020 11:29 AM MRN: [id number] Date of Birth: 03/28/1946 Attending MD: Hildred Laser , MD CSN: [id number] Age: 75 Admit Type: Outpatient Procedure:                Upper GI endoscopy Indications:              Esophageal dysphagia Providers:                Hildred Laser, MD, Gwenlyn Fudge, RN, Randa Spike, Technician Referring MD:             Delphina Cahill, Md Medicines:                Propofol per Anesthesia Complications:            No immediate complications. Estimated Blood Loss:     Estimated blood loss was minimal. Procedure:                Pre-Anesthesia Assessment:                           - Prior to the procedure, a History and Physical                            was performed, and patient medications and                            allergies were reviewed. The patient's tolerance of                            previous anesthesia was also reviewed. The risks                            and benefits of the procedure and the sedation                            options and risks were discussed with the patient.                            All questions were answered, and informed consent                            was obtained. Prior Anticoagulants: The patient has                            taken no previous anticoagulant or antiplatelet                            agents. ASA Grade Assessment: II - A patient with                            mild systemic disease. After reviewing the risks  and benefits, the patient was deemed in                            satisfactory condition to undergo the procedure.                           After obtaining informed consent, the endoscope was                            passed under direct vision. Throughout the                            procedure, the patient's blood pressure, pulse, and                            oxygen  saturations were monitored continuously. The                            [id number]-[id number] ([id number]) scope was introduced through the                            mouth, and advanced to the second part of duodenum.                            The upper GI endoscopy was accomplished without                            difficulty. The patient tolerated the procedure                            well. Scope In: 12:23:46 PM Scope Out: 12:34:34 PM Total Procedure Duration: 0 hours 10 minutes 48 seconds  Findings:      The hypopharynx was normal.      The examined esophagus was normal.      The Z-line was regular.      A 2 cm hiatal hernia was present.      No endoscopic abnormality was evident in the esophagus to explain the       patient's complaint of dysphagia. It was decided, however, to proceed       with dilation of the entire esophagus. The scope was withdrawn. Dilation       was performed with a Maloney dilator with no resistance at 54 Fr and       mild resistance at 54 Fr. The dilation site was examined following       endoscope reinsertion and showed moderate mucosal disruption and no       perforation.      The entire examined stomach was normal.      The duodenal bulb and second portion of the duodenum were normal. Impression:               - Normal hypopharynx.                           - Normal esophagus.                           -  Z-line regular.                           - 2 cm hiatal hernia.                           - No endoscopic esophageal abnormality to explain                            patient's dysphagia. Esophagus dilated. Dilated.                            Mucosal disruption indicative of either subtle                            narrowing and or esophageal web.                           - Normal stomach.                           - Normal duodenal bulb and second portion of the                            duodenum.                           - No specimens collected. Moderate  Sedation:      Per Anesthesia Care Recommendation:           - Patient has a contact number available for                            emergencies. The signs and symptoms of potential                            delayed complications were discussed with the                            patient. Return to normal activities tomorrow.                            Written discharge instructions were provided to the                            patient.                           - Mechanical soft diet today.                           - Continue present medications.                           - No aspirin, ibuprofen, naproxen, or other                            non-steroidal anti-inflammatory  drugs for 3 days.                           - Hycodan liquid 5 mg by mouth every 6 hours as                            needed.                           - Repeat upper endoscopy PRN.                           - Telephone GI clinic in 1 week. Procedure Code(s):        --- Professional ---                           [phone], Esophagogastroduodenoscopy, flexible,                            transoral; diagnostic, including collection of                            specimen(s) by brushing or washing, when performed                            (separate procedure)                           43450, Dilation of esophagus, by unguided sound or                            bougie, single or multiple passes Diagnosis Code(s):        --- Professional ---                           K44.9, Diaphragmatic hernia without obstruction or                            gangrene                           R13.14, Dysphagia, pharyngoesophageal phase CPT copyright 2019 American Medical Association. All rights reserved. The codes documented in this report are preliminary and upon coder review may  be revised to meet current compliance requirements. Hildred Laser, MD Hildred Laser, MD 11/05/2020 12:50:37 PM This report has been signed  electronically. Number of Addenda: 0

## 2020-11-10 ENCOUNTER — Other Ambulatory Visit (INDEPENDENT_AMBULATORY_CARE_PROVIDER_SITE_OTHER): Payer: Self-pay

## 2020-11-10 MED ORDER — PANTOPRAZOLE SODIUM 40 MG PO TBEC: 40.0000 mg | DELAYED_RELEASE_TABLET | Freq: Every day | ORAL | 3 refills | Status: DC

## 2020-11-12 ENCOUNTER — Telehealth (INDEPENDENT_AMBULATORY_CARE_PROVIDER_SITE_OTHER): Payer: Self-pay | Admitting: Internal Medicine

## 2020-11-12 ENCOUNTER — Encounter (HOSPITAL_COMMUNITY): Payer: Self-pay | Admitting: Internal Medicine

## 2020-11-12 NOTE — Telephone Encounter (Signed)
After patients procedure by Dr Laural Golden -  was told to call the office in one week but she didn't know why - please advise - ph# [phone]

## 2020-11-12 NOTE — Telephone Encounter (Signed)
-   Telephone GI clinic in 1 week.( This is from the procedure note).

## 2020-11-12 NOTE — Telephone Encounter (Signed)
I spoke with the patient and she reports that she is no longer having any issues with swallowing, so she is doing good she says. I spoke with Dr. Laural Golden and asked what it was that the patient needed to call the office for and he states to report how her swallowing was doing.

## 2020-11-17 ENCOUNTER — Encounter (INDEPENDENT_AMBULATORY_CARE_PROVIDER_SITE_OTHER): Payer: Self-pay

## 2020-12-01 DIAGNOSIS — H903 Sensorineural hearing loss, bilateral: Secondary | ICD-10-CM | POA: Diagnosis not present

## 2020-12-23 DIAGNOSIS — F329 Major depressive disorder, single episode, unspecified: Secondary | ICD-10-CM | POA: Insufficient documentation

## 2020-12-23 DIAGNOSIS — R7303 Prediabetes: Secondary | ICD-10-CM | POA: Insufficient documentation

## 2020-12-23 DIAGNOSIS — I10 Essential (primary) hypertension: Secondary | ICD-10-CM | POA: Insufficient documentation

## 2020-12-23 DIAGNOSIS — E782 Mixed hyperlipidemia: Secondary | ICD-10-CM | POA: Insufficient documentation

## 2021-01-13 DIAGNOSIS — L570 Actinic keratosis: Secondary | ICD-10-CM | POA: Diagnosis not present

## 2021-01-13 DIAGNOSIS — L821 Other seborrheic keratosis: Secondary | ICD-10-CM | POA: Diagnosis not present

## 2021-01-13 DIAGNOSIS — D1801 Hemangioma of skin and subcutaneous tissue: Secondary | ICD-10-CM | POA: Diagnosis not present

## 2021-01-28 DIAGNOSIS — G44209 Tension-type headache, unspecified, not intractable: Secondary | ICD-10-CM | POA: Diagnosis not present

## 2021-01-28 DIAGNOSIS — I10 Essential (primary) hypertension: Secondary | ICD-10-CM | POA: Diagnosis not present

## 2021-01-28 DIAGNOSIS — K219 Gastro-esophageal reflux disease without esophagitis: Secondary | ICD-10-CM | POA: Diagnosis not present

## 2021-01-28 DIAGNOSIS — E785 Hyperlipidemia, unspecified: Secondary | ICD-10-CM | POA: Diagnosis not present

## 2021-02-27 DIAGNOSIS — Z23 Encounter for immunization: Secondary | ICD-10-CM | POA: Diagnosis not present

## 2021-03-11 DIAGNOSIS — Z1389 Encounter for screening for other disorder: Secondary | ICD-10-CM | POA: Diagnosis not present

## 2021-03-11 DIAGNOSIS — E559 Vitamin D deficiency, unspecified: Secondary | ICD-10-CM | POA: Diagnosis not present

## 2021-03-11 DIAGNOSIS — E538 Deficiency of other specified B group vitamins: Secondary | ICD-10-CM | POA: Diagnosis not present

## 2021-03-11 DIAGNOSIS — Z7189 Other specified counseling: Secondary | ICD-10-CM | POA: Diagnosis not present

## 2021-03-11 DIAGNOSIS — E785 Hyperlipidemia, unspecified: Secondary | ICD-10-CM | POA: Diagnosis not present

## 2021-03-11 DIAGNOSIS — Z0000 Encounter for general adult medical examination without abnormal findings: Secondary | ICD-10-CM | POA: Diagnosis not present

## 2021-03-11 DIAGNOSIS — Z1231 Encounter for screening mammogram for malignant neoplasm of breast: Secondary | ICD-10-CM | POA: Diagnosis not present

## 2021-03-11 DIAGNOSIS — Z23 Encounter for immunization: Secondary | ICD-10-CM | POA: Diagnosis not present

## 2021-03-11 DIAGNOSIS — G44209 Tension-type headache, unspecified, not intractable: Secondary | ICD-10-CM | POA: Diagnosis not present

## 2021-03-11 DIAGNOSIS — I10 Essential (primary) hypertension: Secondary | ICD-10-CM | POA: Diagnosis not present

## 2021-03-11 DIAGNOSIS — K219 Gastro-esophageal reflux disease without esophagitis: Secondary | ICD-10-CM | POA: Diagnosis not present

## 2021-03-24 DIAGNOSIS — L578 Other skin changes due to chronic exposure to nonionizing radiation: Secondary | ICD-10-CM | POA: Diagnosis not present

## 2021-03-24 DIAGNOSIS — L821 Other seborrheic keratosis: Secondary | ICD-10-CM | POA: Diagnosis not present

## 2021-03-24 DIAGNOSIS — L280 Lichen simplex chronicus: Secondary | ICD-10-CM | POA: Diagnosis not present

## 2021-03-24 DIAGNOSIS — L720 Epidermal cyst: Secondary | ICD-10-CM | POA: Diagnosis not present

## 2021-04-13 ENCOUNTER — Ambulatory Visit: Payer: Medicare PPO | Admitting: Neurology

## 2021-04-13 ENCOUNTER — Other Ambulatory Visit: Payer: Self-pay

## 2021-04-13 ENCOUNTER — Encounter: Payer: Self-pay | Admitting: Neurology

## 2021-04-13 ENCOUNTER — Telehealth: Payer: Self-pay | Admitting: Neurology

## 2021-04-13 VITALS — BP 154/79 | HR 74 | Ht 60.0 in | Wt 125.0 lb

## 2021-04-13 DIAGNOSIS — R519 Headache, unspecified: Secondary | ICD-10-CM | POA: Insufficient documentation

## 2021-04-13 DIAGNOSIS — R0683 Snoring: Secondary | ICD-10-CM | POA: Diagnosis not present

## 2021-04-13 MED ORDER — GABAPENTIN 100 MG PO CAPS: 100.0000 mg | ORAL_CAPSULE | Freq: Three times a day (TID) | ORAL | 11 refills | Status: DC

## 2021-04-13 NOTE — Telephone Encounter (Signed)
Pt has a 90 day rx

## 2021-04-13 NOTE — Progress Notes (Addendum)
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       ASSESSMENT AND PLAN  Destiny Norman is a 75 y.o. female   New onset headaches  ESR C-reactive protein to rule out temporal arteritis  Headache responding to gabapentin, refill prescription 100 3 times daily as needed Snoring,  At risk for obstructive sleep apnea,  Refer to sleep study Addendum: MRI of the cervical from [facility] April 22, 2021, mild cervical degenerative changes with disc osteophyte most significant at C5-6, with mild canal stenosis moderate bilateral foraminal narrowing  DIAGNOSTIC DATA (LABS, IMAGING, TESTING) - I reviewed patient records, labs, notes, testing and imaging myself where available.  CT head without contrast on September 15, 2020, generalized atrophy, supratentorium small vessel disease, no acute abnormalities.  Laboratory evaluations in June 2022: Normal BMP, creatinine 0.84, glucose 108, sodium 134,  Laboratory evaluations from [facility] 2022, normal B12, CMP, creatinine 1.03, A1c 6.1, vitamin D 63  MEDICAL HISTORY:  Destiny Norman, is a 75 year old female, seen in request by primary care physician Dr. Leeroy Cha, evaluation of frequent headache, initial evaluation with April 13, 2021.   I reviewed and summarized the referring note.PMHX HLD HTN Breast cancer on right side, mastectomy in 2004.  She denies a previous history of headache, in April 2022, he woke up noticed elevated blood pressure 170/90, then also filled headache pressure sensation at her neck, spreading forward become pressure headaches, ever since then, she had mild to moderate headache, today rated as 7 out of 10 holoacranial headache, it is now bothering her  when she is driving, focusing, becoming more bothersome when she moves her head, bending over  She denies lateralized motor or sensory deficit, I personally reviewed CT head without contrast on September 16, 2020 mild atrophy, supratentorium small vessel disease no acute abnormality  She denies jaw claudication, no loss of vision, no chewing difficulty  She does has very small jaw, drapery soft palate, husband reported frequent snoring, frequent awakening at night sleep, poor sleep quality  PHYSICAL EXAM:   Vitals:   04/13/21 1038  BP: (!) 154/79  Pulse: 74  Weight: 125 lb (56.7 kg)  Height: 5' (1.524 m)   Not recorded     Body mass index is 24.41 kg/m.  PHYSICAL EXAMNIATION:  Gen: NAD, conversant, well nourised, well groomed                     Cardiovascular: Regular rate rhythm, no peripheral edema, warm, nontender. Eyes: Conjunctivae clear without exudates or hemorrhage Neck: Supple, no carotid bruits. Pulmonary: Clear to auscultation bilaterally   NEUROLOGICAL EXAM:  MENTAL STATUS: Speech:    Speech is normal; fluent and spontaneous with normal comprehension.  Cognition:     Orientation to time, place and person     Normal recent and remote memory     Normal Attention span and concentration     Normal Language, naming, repeating,spontaneous speech     Fund of knowledge   CRANIAL NERVES: CN II: Visual fields are full to confrontation. Pupils are round equal and briskly reactive to light. CN III, IV, VI: extraocular movement are normal. No ptosis. CN V: Facial sensation is intact to light touch CN VII: Face is symmetric with normal eye  closure  CN VIII: Hearing is normal to causal conversation. CN IX, X: Phonation is normal. CN XI: Head turning and shoulder shrug are intact  MOTOR: There is no pronator drift of out-stretched arms. Muscle bulk and tone are normal. Muscle strength is normal.  REFLEXES: Reflexes are 2+ and symmetric at the biceps, triceps, knees,  and ankles. Plantar responses are flexor.  SENSORY: Intact to light touch, pinprick and vibratory sensation are intact in fingers and toes.  COORDINATION: There is no trunk or limb dysmetria noted.  GAIT/STANCE: Posture is normal. Gait is steady with normal steps, base, arm swing, and turning. Heel and toe walking are normal. Tandem gait is normal.  Romberg is absent.  REVIEW OF SYSTEMS:  Full 14 system review of systems performed and notable only for as above All other review of systems were negative.   ALLERGIES: Allergies  Allergen Reactions   Penicillins Rash    HOME MEDICATIONS: Current Outpatient Medications  Medication Sig Dispense Refill   Cholecalciferol (VITAMIN D3) 1000 UNITS CAPS Take 1,000 Units by mouth daily.     Cyanocobalamin (VITAMIN B 12 PO) Take by mouth.     lisinopril-hydrochlorothiazide (ZESTORETIC) 20-25 MG tablet Take 1 tablet by mouth daily.     Propylene Glycol (SYSTANE BALANCE) 0.6 % SOLN Place 1 drop into both eyes every morning.     rosuvastatin (CRESTOR) 10 MG tablet Take 10 mg by mouth daily.     No current facility-administered medications for this visit.    PAST MEDICAL HISTORY: Past Medical History:  Diagnosis Date   Breast cancer ([place])    High cholesterol    Hypertension    Vitamin D deficiency     PAST SURGICAL HISTORY: Past Surgical History:  Procedure Laterality Date   CESAREAN SECTION     COLONOSCOPY N/A 07/04/2014   Procedure: COLONOSCOPY;  Surgeon: Rogene Houston, MD;  [facility];  Service: Endoscopy;  Laterality: N/A;  830   ESOPHAGEAL DILATION N/A 11/05/2020   Procedure: ESOPHAGEAL DILATION;  Surgeon: Rogene Houston, MD;  [facility];  Service: Endoscopy;  Laterality: N/A;   ESOPHAGOGASTRODUODENOSCOPY (EGD) WITH PROPOFOL N/A 11/05/2020   Procedure: ESOPHAGOGASTRODUODENOSCOPY (EGD) WITH PROPOFOL;  Surgeon: Rogene Houston, MD;  [facility];  Service: Endoscopy;  Laterality: N/A;  2:15    MASTECTOMY Right 2004   RADIOLOGY WITH ANESTHESIA N/A 09/10/2014   Procedure: MRI ABDOMIN WITH AND WITHOUT;  Surgeon: Medication Radiologist, MD;  Location: [facility];  Service: Radiology;  Laterality: N/A;   RECONSTRUCTION BREAST W/ TRAM FLAP      FAMILY HISTORY: Family History  Problem Relation Age of Onset   Hypertension Mother    Lung cancer Mother    Cancer Brother    Prostate cancer Father    Skin cancer Sister    Melanoma Sister    Prostate cancer Brother    Healthy Son     SOCIAL HISTORY: Social History   Socioeconomic History   Marital status: Married    Spouse name: Not on file   Number of children: Not on file   Years of education: Not on file   Highest education level: Not on file  Occupational History   Not on file  Tobacco Use   Smoking status: Never   Smokeless tobacco: Never  Substance and Sexual Activity   Alcohol use: Yes    Comment: occasional glass of wine   Drug use: No   Sexual activity: Yes  Other Topics Concern  Not on file  Social History Narrative   Not on file   Social Determinants of Health   Financial Resource Strain: Not on file  Food Insecurity: Not on file  Transportation Needs: Not on file  Physical Activity: Not on file  Stress: Not on file  Social Connections: Not on file  Intimate Partner Violence: Not on file      Marcial Pacas, M.D. Ph.D.  [facility] [address] [place], [place] [address] Ph: [phone] Fax: [phone]  CC:  Leeroy Cha, MD [address] [place],  [place] [address]  Leeroy Cha, MD

## 2021-04-13 NOTE — Telephone Encounter (Signed)
Pt called, pharmacy only filled prescription for 30 tablets for gabapentin (NEURONTIN) 100 MG capsule. Informed pt prescription has 90 tablets. Pt said will call the pharmacy, if I need to I will call back.

## 2021-04-14 LAB — CBC WITH DIFFERENTIAL
Basophils Absolute: 0 10*3/uL (ref 0.0–0.2)
Basos: 1 %
EOS (ABSOLUTE): 0.2 10*3/uL (ref 0.0–0.4)
Eos: 2 %
Hematocrit: 36.4 % (ref 34.0–46.6)
Hemoglobin: 11.7 g/dL (ref 11.1–15.9)
Immature Grans (Abs): 0 10*3/uL (ref 0.0–0.1)
Immature Granulocytes: 1 %
Lymphocytes Absolute: 1.2 10*3/uL (ref 0.7–3.1)
Lymphs: 18 %
MCH: 27.1 pg (ref 26.6–33.0)
MCHC: 32.1 g/dL (ref 31.5–35.7)
MCV: 84 fL (ref 79–97)
Monocytes Absolute: 0.5 10*3/uL (ref 0.1–0.9)
Monocytes: 8 %
Neutrophils Absolute: 4.6 10*3/uL (ref 1.4–7.0)
Neutrophils: 70 %
RBC: 4.32 x10E6/uL (ref 3.77–5.28)
RDW: 14 % (ref 11.7–15.4)
WBC: 6.5 10*3/uL (ref 3.4–10.8)

## 2021-04-14 LAB — TSH: TSH: 2.63 u[IU]/mL (ref 0.450–4.500)

## 2021-04-14 LAB — SEDIMENTATION RATE: Sed Rate: 18 mm/hr (ref 0–40)

## 2021-04-14 LAB — C-REACTIVE PROTEIN: CRP: <1 mg/L (ref 0–10)

## 2021-04-19 ENCOUNTER — Encounter: Payer: Self-pay | Admitting: Neurology

## 2021-04-20 ENCOUNTER — Telehealth: Payer: Self-pay | Admitting: Neurology

## 2021-04-20 DIAGNOSIS — R519 Headache, unspecified: Secondary | ICD-10-CM

## 2021-04-20 DIAGNOSIS — M542 Cervicalgia: Secondary | ICD-10-CM | POA: Insufficient documentation

## 2021-04-20 MED ORDER — ALPRAZOLAM 0.5 MG PO TABS: ORAL_TABLET | ORAL | 0 refills | Status: DC

## 2021-04-20 MED ORDER — GABAPENTIN 300 MG PO CAPS: 300.0000 mg | ORAL_CAPSULE | Freq: Three times a day (TID) | ORAL | 11 refills | Status: DC

## 2021-04-20 NOTE — Telephone Encounter (Signed)
Orders Placed This Encounter  Procedures   MR CERVICAL SPINE WO CONTRAST      Meds ordered this encounter  Medications   ALPRAZolam (XANAX) 0.5 MG tablet    Sig: Take 1-2 tablets 30 minutes prior to MRI, may repeat once as needed. Must have driver.    Dispense:  3 tablet    Refill:  0   gabapentin (NEURONTIN) 300 MG capsule    Sig: Take 1 capsule (300 mg total) by mouth 3 (three) times daily.    Dispense:  90 capsule    Refill:  11     I have called patient, she complains of frequent headaches, especially with frequent neck turning, also mildly unsteady gait, does complains of neck pain,  Ordered MRI of cervical spine, she wants to do it in an open MRI, Xanax as needed  Also advised her gabapentin 300 mg up to 3 times a day for her headache, which has been helpful new prescription was called in

## 2021-04-20 NOTE — Telephone Encounter (Signed)
I failed to reach patient by home and cellphone. Left message for her to call back.

## 2021-04-20 NOTE — Addendum Note (Signed)
Addended by: Marcial Pacas on: 04/20/2021 01:54 PM   Modules accepted: Orders

## 2021-04-20 NOTE — Telephone Encounter (Signed)
Pt returned call. Please call back.

## 2021-04-22 DIAGNOSIS — M4802 Spinal stenosis, cervical region: Secondary | ICD-10-CM | POA: Diagnosis not present

## 2021-04-22 DIAGNOSIS — M50221 Other cervical disc displacement at C4-C5 level: Secondary | ICD-10-CM | POA: Diagnosis not present

## 2021-04-23 NOTE — Telephone Encounter (Signed)
Noted, 04/21/21 [employer] PA #[id number] (04/21/21- 05/21/21) sent to [facility] for open MRI TF

## 2021-04-27 ENCOUNTER — Telehealth: Payer: Self-pay | Admitting: Neurology

## 2021-04-27 ENCOUNTER — Encounter: Payer: Self-pay | Admitting: Neurology

## 2021-04-27 NOTE — Telephone Encounter (Signed)
MRI of cervical was sent through mychart.

## 2021-04-29 ENCOUNTER — Telehealth: Payer: Self-pay | Admitting: Neurology

## 2021-04-29 NOTE — Telephone Encounter (Signed)
See phone note in Epic. Called patient.

## 2021-04-29 NOTE — Telephone Encounter (Signed)
I spoke to the patient. States she is frustrated over having daily headaches since April 2022. She had normal labs and no treatable causes identified on her cervical MRI. She wanted to know what to do now. The next step is a sleep study. She was scheduled originally on 07/01/21. Able to move her appt into a cancelled slot on 05/04/21. She will also continue taking her gabapentin 300mg , one cap TID.

## 2021-04-29 NOTE — Telephone Encounter (Signed)
Pt states she has somewhat been given the results to her MRI.  Pt states her headaches have not been addressed yet.  Pt asking for a call from RN on her mobile.

## 2021-05-04 ENCOUNTER — Ambulatory Visit: Payer: Medicare PPO | Admitting: Neurology

## 2021-05-04 ENCOUNTER — Telehealth: Payer: Self-pay | Admitting: Neurology

## 2021-05-04 ENCOUNTER — Encounter: Payer: Self-pay | Admitting: Neurology

## 2021-05-04 VITALS — BP 140/77 | HR 68 | Ht 60.0 in | Wt 127.6 lb

## 2021-05-04 DIAGNOSIS — R0683 Snoring: Secondary | ICD-10-CM

## 2021-05-04 DIAGNOSIS — R519 Headache, unspecified: Secondary | ICD-10-CM | POA: Diagnosis not present

## 2021-05-04 DIAGNOSIS — G478 Other sleep disorders: Secondary | ICD-10-CM

## 2021-05-04 DIAGNOSIS — R351 Nocturia: Secondary | ICD-10-CM | POA: Diagnosis not present

## 2021-05-04 NOTE — Progress Notes (Signed)
Subjective:    Patient ID: Destiny Norman is a 75 y.o. female.  HPI    Star Age, MD, PhD [facility] [address], [address] [place], [place] [address]  Dear Aliene Beams,   I saw your patient, Destiny Norman, upon your kind request in my sleep clinic today for initial consultation of her sleep disorder, in particular, concern for underlying obstructive sleep apnea.  The patient is unaccompanied today.  As you know, Ms. Argo is a 75 year old right-handed woman with an underlying medical history, hyperlipidemia, breast cancer, vitamin D deficiency, and recurrent headaches, who reports snoring recurrent headaches since April 2022.  She reports that she first woke up with her headache and had a blood pressure spike.  Her blood pressure has been under control since then but she still has nearly daily headaches.  She has been on gabapentin which is currently 300 mg 3 times daily.  Her husband reports that she snores.  She does not wake up fully rested at times.  She does not have a family history of sleep apnea.  She does have nocturia about once or twice per average night, bedtime generally is around 10 PM and she likes to wait before falling asleep, rise time is around 9 PM.  She does not have a TV in her bedroom, currently no pets in the household.  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  She lives with her husband.  I reviewed your office note from 04/13/2021.  Her Epworth sleepiness score is 3 out of 24, fatigue severity score is 16 out of 63.  Of note, she had a head CT in April 2022 which showed no acute findings, she had a recent cervical spine MRI in November 2022 which showed mild degenerative changes.  Patient is wondering if her headaches are cervicogenic in nature.  She drinks caffeine in the form of coffee which is half caff, about 2 cups in the mornings.  Her Past Medical History Is Significant For: Past Medical History:  Diagnosis  Date   Breast cancer ([place])    High cholesterol    Hypertension    Vitamin D deficiency     Her Past Surgical History Is Significant For: Past Surgical History:  Procedure Laterality Date   CESAREAN SECTION     COLONOSCOPY N/A 07/04/2014   Procedure: COLONOSCOPY;  Surgeon: Rogene Houston, MD;  [facility];  Service: Endoscopy;  Laterality: N/A;  830   ESOPHAGEAL DILATION N/A 11/05/2020   Procedure: ESOPHAGEAL DILATION;  Surgeon: Rogene Houston, MD;  [facility];  Service: Endoscopy;  Laterality: N/A;   ESOPHAGOGASTRODUODENOSCOPY (EGD) WITH PROPOFOL N/A 11/05/2020   Procedure: ESOPHAGOGASTRODUODENOSCOPY (EGD) WITH PROPOFOL;  Surgeon: Rogene Houston, MD;  [facility];  Service: Endoscopy;  Laterality: N/A;  2:15   MASTECTOMY Right 2004   RADIOLOGY WITH ANESTHESIA N/A 09/10/2014   Procedure: MRI ABDOMIN WITH AND WITHOUT;  Surgeon: Medication Radiologist, MD;  Location: [facility];  Service: Radiology;  Laterality: N/A;   RECONSTRUCTION BREAST W/ TRAM FLAP      Her Family History Is Significant For: Family History  Problem Relation Age of Onset   Hypertension Mother    Lung cancer Mother    Cancer Brother    Prostate cancer Father    Skin cancer Sister    Melanoma Sister    Prostate cancer Brother    Healthy Son     Her Social History Is Significant  For: Social History   Socioeconomic History   Marital status: Married    Spouse name: Not on file   Number of children: Not on file   Years of education: Not on file   Highest education level: Not on file  Occupational History   Not on file  Tobacco Use   Smoking status: Never   Smokeless tobacco: Never  Substance and Sexual Activity   Alcohol use: Yes    Comment: occasional glass of wine   Drug use: No   Sexual activity: Yes  Other Topics Concern   Not on file  Social History Narrative   Not on file   Social Determinants of Health   Financial Resource Strain: Not on file  Food  Insecurity: Not on file  Transportation Needs: Not on file  Physical Activity: Not on file  Stress: Not on file  Social Connections: Not on file    Her Allergies Are:  Allergies  Allergen Reactions   Penicillins Rash  :   Her Current Medications Are:  Outpatient Encounter Medications as of 05/04/2021  Medication Sig   Cholecalciferol (VITAMIN D3) 1000 UNITS CAPS Take 1,000 Units by mouth daily.   Cyanocobalamin (VITAMIN B 12 PO) Take by mouth.   gabapentin (NEURONTIN) 300 MG capsule Take 1 capsule (300 mg total) by mouth 3 (three) times daily.   lisinopril-hydrochlorothiazide (ZESTORETIC) 20-25 MG tablet Take 1 tablet by mouth daily.   Propylene Glycol (SYSTANE BALANCE) 0.6 % SOLN Place 1 drop into both eyes every morning.   rosuvastatin (CRESTOR) 10 MG tablet Take 10 mg by mouth daily.   No facility-administered encounter medications on file as of 05/04/2021.  :   Review of Systems:  Out of a complete 14 point review of systems, all are reviewed and negative with the exception of these symptoms as listed below:  Review of Systems  Neurological:        Snoring, new onset headache.  (Since April 2022 woke up with headache.  Has had daily wakening headaches since then.  She does snore, no apnea tht she is aware.  Has had increased Bp back of head pain/parethesia.  Takes gabapentin.  ? Sleep related).   ESS 3. FSS 16.    Objective:  Neurological Exam  Physical Exam Physical Examination:   Vitals:   05/04/21 1105  BP: 140/77  Pulse: 68    General Examination: The patient is a very pleasant 75 y.o. female in no acute distress. She appears well-developed and well-nourished and well groomed.   HEENT: Normocephalic, atraumatic, pupils are equal, round and reactive to light, extraocular tracking is good without limitation to gaze excursion or nystagmus noted. Hearing is grossly intact. Face is symmetric with normal facial animation. Speech is clear with no dysarthria noted.  There is no hypophonia. There is no lip, neck/head, jaw or voice tremor. Neck is supple with full range of passive and active motion. There are no carotid bruits on auscultation. Oropharynx exam reveals: mild mouth dryness, adequate dental hygiene and mild airway crowding, due to small airway entry, Mallampati class III.  Neck circumference of 13 inches, no significant overbite.  Tongue protrudes centrally and palate elevates symmetrically.  Chest: Clear to auscultation without wheezing, rhonchi or crackles noted.  Heart: S1+S2+0, regular and normal without murmurs, rubs or gallops noted.   Abdomen: Soft, non-tender and non-distended.  Extremities: There is no obvious edema in the distal lower extremities bilaterally.   Skin: Warm and dry without trophic changes noted.  Musculoskeletal: exam reveals no obvious joint deformities.   Neurologically:  Mental status: The patient is awake, alert and oriented in all 4 spheres. Her immediate and remote memory, attention, language skills and fund of knowledge are appropriate. There is no evidence of aphasia, agnosia, apraxia or anomia. Speech is clear with normal prosody and enunciation. Thought process is linear. Mood is normal and affect is normal.  Cranial nerves II - XII are as described above under HEENT exam.  Motor exam: Normal bulk, strength and tone is noted. There is no tremor, fine motor skills and coordination: grossly intact.  Cerebellar testing: No dysmetria or intention tremor. There is no truncal or gait ataxia.  Sensory exam: intact to light touch in the upper and lower extremities.  Gait, station and balance: She stands easily. No veering to one side is noted. No leaning to one side is noted. Posture is age-appropriate and stance is narrow based. Gait shows normal stride length and normal pace. No problems turning are noted.  Assessment and Plan:   In summary, Loan Oguin is a very pleasant 75 y.o.-year old female with an  underlying medical history, hyperlipidemia, breast cancer, vitamin D deficiency, and recurrent headaches, whose history and physical exam are concerning for obstructive sleep apnea (OSA). I had a long chat with the patient about my findings and the diagnosis of OSA, its prognosis and treatment options. We talked about medical treatments, surgical interventions and non-pharmacological approaches. I explained in particular the risks and ramifications of untreated moderate to severe OSA, especially with respect to developing cardiovascular disease down the Road, including congestive heart failure, difficult to treat hypertension, cardiac arrhythmias, or stroke. Even type 2 diabetes has, in part, been linked to untreated OSA. Symptoms of untreated OSA include daytime sleepiness, memory problems, mood irritability and mood disorder such as depression and anxiety, lack of energy, as well as recurrent headaches, especially morning headaches. We talked about trying to maintain a healthy lifestyle in general, as well as the importance of weight control. We also talked about the importance of good sleep hygiene. I recommended the following at this time: sleep study.  I outlined the differences between a laboratory attended sleep study versus home sleep test. I explained the sleep test procedure to the patient and also outlined possible surgical and non-surgical treatment options of OSA, including the use of a custom-made dental device (which would require a referral to a specialist dentist or oral surgeon), upper airway surgical options, such as traditional UPPP or a novel less invasive surgical option in the form of Inspire hypoglossal nerve stimulation (which would involve a referral to an ENT surgeon). I also explained the CPAP treatment option to the patient, who indicated that she would be willing to try CPAP if the need arises. I explained the importance of being compliant with PAP treatment, not only for insurance  purposes but primarily to improve Her symptoms, and for the patient's long term health benefit, including to reduce Her cardiovascular risks. I answered all her questions today and the patient was in agreement. I plan to see her back after the sleep study is completed and encouraged her to call with any interim questions, concerns, problems or updates.   Thank you very much for allowing me to participate in the care of this nice patient. If I can be of any further assistance to you please do not hesitate to talk to me.    Sincerely,   Star Age, MD, PhD

## 2021-05-04 NOTE — Telephone Encounter (Signed)
I called pt and LMVM for her that her insurance makes the determination on what they will pay for.  (If home or in lab).  I will forward message to them as FYI.  She is to call back if questions

## 2021-05-04 NOTE — Telephone Encounter (Signed)
Spoke to pt and she is ok with either depending on insurance approval.

## 2021-05-04 NOTE — Telephone Encounter (Signed)
Pt called, decided would like to come to the Sleep lab for the sleep test. Want to check if Sleep Lab had been sent to the insurance. Would like a call from the nurse.

## 2021-05-21 ENCOUNTER — Telehealth: Payer: Self-pay | Admitting: Neurology

## 2021-05-21 NOTE — Telephone Encounter (Signed)
Pt. Will call back later today to schedule her sleep study

## 2021-05-22 ENCOUNTER — Other Ambulatory Visit: Payer: Self-pay

## 2021-05-22 ENCOUNTER — Ambulatory Visit (INDEPENDENT_AMBULATORY_CARE_PROVIDER_SITE_OTHER): Payer: Medicare PPO | Admitting: Neurology

## 2021-05-22 DIAGNOSIS — G4720 Circadian rhythm sleep disorder, unspecified type: Secondary | ICD-10-CM

## 2021-05-22 DIAGNOSIS — R0683 Snoring: Secondary | ICD-10-CM

## 2021-05-22 DIAGNOSIS — G478 Other sleep disorders: Secondary | ICD-10-CM

## 2021-05-22 DIAGNOSIS — G4733 Obstructive sleep apnea (adult) (pediatric): Secondary | ICD-10-CM

## 2021-05-22 DIAGNOSIS — R351 Nocturia: Secondary | ICD-10-CM

## 2021-05-22 DIAGNOSIS — R519 Headache, unspecified: Secondary | ICD-10-CM

## 2021-06-03 NOTE — Procedures (Signed)
PATIENT'S NAME:  Destiny Norman, Destiny Norman DOB:      [birth year]      MR#:    [id number]     DATE OF RECORDING: 05/22/2021 REFERRING M.D.:  Marcial Pacas, MD Study Performed:   Baseline Polysomnogram HISTORY: 76 year old woman with a history, hyperlipidemia, breast cancer, vitamin D deficiency, and recurrent headaches, who reports snoring recurrent headaches since April 2022. The patient endorsed the Epworth Sleepiness Scale at 3 points. The patient's weight 127 pounds with a height of 60 (inches), resulting in a BMI of 25.1 kg/m2. The patient's neck circumference measured 13 inches.  CURRENT MEDICATIONS: Vitamin D3, Vitamin B12, Neurontin, Zestoretic, Crestor   PROCEDURE:  This is a multichannel digital polysomnogram utilizing the Somnostar 11.2 system.  Electrodes and sensors were applied and monitored per AASM Specifications.   EEG, EOG, Chin and Limb EMG, were sampled at 200 Hz.  ECG, Snore and Nasal Pressure, Thermal Airflow, Respiratory Effort, CPAP Flow and Pressure, Oximetry was sampled at 50 Hz. Digital video and audio were recorded.      BASELINE STUDY  Lights Out was at 22:03 and Lights On at 05:00.  Total recording time (TRT) was 418 minutes, with a total sleep time (TST) of 219.5 minutes.   The patient's sleep latency was 14 minutes.  REM latency was 144.5 minutes, which is mildly delayed. The sleep efficiency was 52.5%, which is reduced.     SLEEP ARCHITECTURE: WASO (Wake after sleep onset) was 184 minutes with one long period of wakefulness and otherwise mild sleep fragmentation noted.  There were 9 minutes in Stage N1, 155.5 minutes Stage N2, 44 minutes Stage N3 and 11 minutes in Stage REM.  The percentage of Stage N1 was 4.1%, Stage N2 was 70.8%, which is markedly increased, Stage N3 was 20.%, which is normal, and Stage R (REM sleep) was 5.%, which is markedly reduced. The arousals were noted as: 38 were spontaneous, 0 were associated with PLMs, 46 were associated with respiratory  events.  RESPIRATORY ANALYSIS:  There were a total of 78 respiratory events:  50 obstructive apneas, 0 central apneas and 0 mixed apneas with a total of 50 apneas and an apnea index (AI) of 13.7 /hour. There were 28 hypopneas with a hypopnea index of 7.7 /hour. The patient also had 0 respiratory event related arousals (RERAs).      The total APNEA/HYPOPNEA INDEX (AHI) was 21.3/hour and the total RESPIRATORY DISTURBANCE INDEX was  21.3 /hour.  6 events occurred in REM sleep and 56 events in NREM. The REM AHI was  32.7 /hour, versus a non-REM AHI of 20.7. The patient spent 166.5 minutes of total sleep time in the supine position and 53 minutes in non-supine.. The supine AHI was 23.5 versus a non-supine AHI of 14.8.  OXYGEN SATURATION & C02:  The Wake baseline 02 saturation was 96%, with the lowest being 87%. Time spent below 89% saturation equaled 0 minutes.  PERIODIC LIMB MOVEMENTS: The patient had a total of 0 Periodic Limb Movements.  The Periodic Limb Movement (PLM) index was 0 and the PLM Arousal index was 0/hour.  Audio and video analysis did not show any abnormal or unusual movements, behaviors, phonations or vocalizations. The patient took no bathroom breaks. Mild to moderate snoring was noted, at times louder. The EKG was in keeping with normal sinus rhythm (NSR).  Post-study, the patient indicated that sleep was the same as usual.   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 Treatment with positive airway pressure in the form of CPAP is recommended. This will require a full night titration study to optimize therapy. Other treatment options may include avoidance of supine sleep position along with some weight loss, surgical options or the use  of an oral appliance.    Please note that untreated obstructive sleep apnea may carry additional perioperative morbidity. Patients with significant obstructive sleep apnea should receive perioperative PAP therapy and the surgeons and particularly the anesthesiologist should be informed of the diagnosis and the severity of the sleep disordered breathing. This study shows sleep fragmentation and abnormal sleep stage percentages; these are nonspecific findings and per se do not signify an intrinsic sleep disorder or a cause for the patient's sleep-related symptoms. Causes include (but are not limited to) the first night effect of the sleep study, circadian rhythm disturbances, medication effect or an underlying mood disorder or medical problem.  The patient should be cautioned not to drive, work at heights, or operate dangerous or heavy equipment when tired or sleepy. Review and reiteration of good sleep hygiene measures should be pursued with any patient. The patient will be seen in follow-up in the sleep clinic at [facility] [place] for discussion of the test results, symptom and treatment compliance review, further management strategies, etc. The referring provider will be notified of the test results.  I certify that I have reviewed the entire raw data recording prior to the issuance of this report in accordance with the Standards of Accreditation of the American Academy of Sleep Medicine (AASM)  Star Age, MD, PhD Diplomat, American Board of Neurology and Sleep Medicine (Neurology and Sleep Medicine)

## 2021-06-03 NOTE — Addendum Note (Signed)
Addended by: Star Age on: 06/03/2021 06:05 PM   Modules accepted: Orders

## 2021-06-04 ENCOUNTER — Telehealth: Payer: Self-pay | Admitting: *Deleted

## 2021-06-04 NOTE — Telephone Encounter (Addendum)
Spoke with the patient and discussed the sleep study results.  Patient understands her sleep study showed moderate obstructive sleep apnea.  Treatment for this is recommended in the form of CPAP.  Patient aware this will require a repeat sleep study to ensure we have the proper settings and mask fitting and monitoring of saturations.  Patient verbalized understanding and her questions were answered.  She agrees to the repeat sleep study.  She did ask if we know how much money she is responsible for in regards to this sleep test that was just completed.  I advised a message will be sent to billing. Pt will give the sleep lab a call in about 2 weeks if she hasn't heard about scheduling.   ----- Message from Star Age, MD sent at 06/03/2021  6:05 PM EST ----- Patient referred by Dr. Krista Blue, seen by me on 05/04/21, diagnostic PSG on 05/22/21.   Please call and notify the patient that the recent sleep study showed moderate obstructive sleep apnea. I recommend treatment for this in the form of CPAP. This will require a repeat sleep study for proper titration and mask fitting and correct monitoring of the oxygen saturations. Please explain to patient. I have placed an order in the chart. Thanks.  Star Age, MD, PhD [facility])

## 2021-06-11 ENCOUNTER — Encounter: Payer: Self-pay | Admitting: Neurology

## 2021-06-11 NOTE — Telephone Encounter (Signed)
Spoke to patient wanted to know if her insurance is going to approve second sleep study . Informed patient please give another week for approval. Informed patient pt it does take at  least two or more weeks  for insurance approval. Pt states she is frustrated but understands. Patient thanked me for calling

## 2021-06-11 NOTE — Telephone Encounter (Signed)
Error

## 2021-06-11 NOTE — Telephone Encounter (Signed)
Pt would like a call from the nurse to discuss second sleep study.

## 2021-06-21 ENCOUNTER — Encounter: Payer: Self-pay | Admitting: Neurology

## 2021-06-24 ENCOUNTER — Other Ambulatory Visit: Payer: Self-pay

## 2021-06-24 ENCOUNTER — Ambulatory Visit: Payer: Medicare PPO | Admitting: Neurology

## 2021-06-24 DIAGNOSIS — G4733 Obstructive sleep apnea (adult) (pediatric): Secondary | ICD-10-CM | POA: Diagnosis not present

## 2021-06-24 DIAGNOSIS — G4720 Circadian rhythm sleep disorder, unspecified type: Secondary | ICD-10-CM

## 2021-06-24 DIAGNOSIS — R519 Headache, unspecified: Secondary | ICD-10-CM

## 2021-06-24 DIAGNOSIS — R351 Nocturia: Secondary | ICD-10-CM

## 2021-06-24 DIAGNOSIS — G478 Other sleep disorders: Secondary | ICD-10-CM

## 2021-07-01 ENCOUNTER — Institutional Professional Consult (permissible substitution): Payer: Medicare PPO | Admitting: Neurology

## 2021-07-02 ENCOUNTER — Encounter: Payer: Self-pay | Admitting: Neurology

## 2021-07-02 NOTE — Procedures (Signed)
PATIENT'S NAME:  Destiny Norman, Destiny Norman DOB:      04-02-1946      MR#:    [id number]     DATE OF RECORDING: 06/24/2021 REFERRING M.D.:  Marcial Pacas, MD Study Performed:   CPAP  Titration HISTORY: 76 year old woman with hyperlipidemia, breast cancer, vitamin D deficiency, and recurrent headaches, who presents for a full night titration study to treat her OSA. Her baseline sleep study from 05/22/21 showed moderate to severe obstructive sleep apnea, with a total AHI of 21.3/hour, REM AHI of 32.7/hour, supine AHI of 23.5/hour and O2 nadir of 87%. The patient endorsed the Epworth Sleepiness Scale at 3 points. The patient's weight 128 pounds with a height of 60 (inches), resulting in a BMI of 25.1 kg/m2. The patient's neck circumference measured 13 inches.  CURRENT MEDICATIONS: Vitamin D3, Vitamin B12, Neurontin, Zestoretic, Crestor  PROCEDURE:  This is a multichannel digital polysomnogram utilizing the SomnoStar 11.2 system.  Electrodes and sensors were applied and monitored per AASM Specifications.   EEG, EOG, Chin and Limb EMG, were sampled at 200 Hz.  ECG, Snore and Nasal Pressure, Thermal Airflow, Respiratory Effort, CPAP Flow and Pressure, Oximetry was sampled at 50 Hz. Digital video and audio were recorded.      The patient was fitted with a small Vitera FFM (patient's preference over P10 and Eson 2 nasal mask). CPAP was initiated at 5 cmH20 with heated humidity per AASM standards and pressure was advanced to 13 cmH20 because of hypopneas, apneas and desaturations.  At a PAP pressure of 13 cmH20, there was a reduction of the AHI to 0/hour with brief supine NREM sleep achieved and O2 nadir of 94%.   Lights Out was at 22:03 and Lights On at 04:49. Total recording time (TRT) was 407 minutes, with a total sleep time (TST) of 258.5 minutes. The patient's sleep latency was 24 minutes. REM sleep was absent. The sleep efficiency was 63.5 %, which is reduced.    SLEEP ARCHITECTURE: WASO (Wake after sleep onset)  was 139.5  minutes with one longer period of wakefulness and otherwise moderate sleep fragmentation noted. There were 9 minutes in Stage N1, 196 minutes Stage N2, 53.5 minutes Stage N3 and 0 minutes in Stage REM.  The percentage of Stage N1 was 3.5%, Stage N2 was 75.8%, which is markedly increased, Stage N3 was 20.7% and Stage R (REM sleep) was absent. The arousals were noted as: 70 were spontaneous, 1 were associated with PLMs, 53 were associated with respiratory events.  RESPIRATORY ANALYSIS:  There was a total of 65 respiratory events: 65 obstructive apneas, 0 central apneas and 0 mixed apneas with a total of 65 apneas and an apnea index (AHI) of 15.1 /hour. There were 0 hypopneas with a hypopnea index of 0/hour. The patient also had 0 respiratory event related arousals (RERAs).      The total APNEA/HYPOPNEA INDEX  (AHI) was 15.1 /hour and the total RESPIRATORY DISTURBANCE INDEX was 15.1 /hour  0 events occurred in REM sleep and 65 events in NREM. The REM AHI was 0 /hour versus a non-REM AHI of 15.1 /hour.  The patient spent 175.5 minutes of total sleep time in the supine position and 83 minutes in non-supine. The supine AHI was 22.2, versus a non-supine AHI of 0.0.  OXYGEN SATURATION & C02:  The baseline 02 saturation was 97%, with the lowest being 88%. Time spent below 89% saturation equaled 0 minutes.  PERIODIC LIMB MOVEMENTS:  The patient had a total of 6 Periodic Limb  Movements. The Periodic Limb Movement (PLM) index was 1.4 and the PLM Arousal index was .2 /hour.  Audio and video analysis did not show any abnormal or unusual movements, behaviors, phonations or vocalizations. The patient took 1 bathroom break. Snoring was reduced. The EKG was in keeping with normal sinus rhythm.  Post-study, the patient indicated that sleep was the same as usual.   IMPRESSION:   Obstructive Sleep Apnea (OSA) Dysfunctions associated with sleep stages or arousal from sleep   RECOMMENDATIONS:   This study demonstrates  reduction of the patient's obstructive sleep apnea with CPAP therapy. The study was limited by decreased sleep consolidation and absence of REM sleep. I will recommend a home CPAP treatment pressure of 13 cm via small FFM with heated humidity. The patient will be advised to be fully compliant with PAP therapy to improve sleep related symptoms and decrease long term cardiovascular risks. The patient should be reminded, that it may take up to 3 months to get fully used to using PAP with all planned sleep. The earlier full compliance is achieved, the better long term compliance tends to be. Please note that untreated obstructive sleep apnea may carry additional perioperative morbidity. Patients with significant obstructive sleep apnea should receive perioperative PAP therapy and the surgeons and particularly the anesthesiologist should be informed of the diagnosis and the severity of the sleep disordered breathing. This study shows sleep fragmentation and abnormal sleep stage percentages; these are nonspecific findings and per se do not signify an intrinsic sleep disorder or a cause for the patient's sleep-related symptoms. Causes include (but are not limited to) the first night effect of the sleep study, circadian rhythm disturbances, medication effect or an underlying mood disorder or medical problem.  The patient should be cautioned not to drive, work at heights, or operate dangerous or heavy equipment when tired or sleepy. Review and reiteration of good sleep hygiene measures should be pursued with any patient. The patient will be seen in follow-up in the sleep clinic at [facility] for discussion of the test results, symptom and treatment compliance review, further management strategies, etc. The referring provider will be notified of the test results.   I certify that I have reviewed the entire raw data recording prior to the issuance of this report in accordance with the Standards of Accreditation of the American  Academy of Sleep Medicine (AASM)   Star Age, MD, PhD Diplomat, American Board of Neurology and Sleep Medicine (Neurology and Sleep Medicine)

## 2021-07-02 NOTE — Addendum Note (Signed)
Addended by: Star Age on: 07/02/2021 05:54 PM   Modules accepted: Orders

## 2021-07-06 ENCOUNTER — Telehealth: Payer: Self-pay

## 2021-07-06 ENCOUNTER — Encounter: Payer: Self-pay | Admitting: *Deleted

## 2021-07-06 NOTE — Telephone Encounter (Signed)
Fax confirmation received from [employer].

## 2021-07-06 NOTE — Telephone Encounter (Signed)
At 3:25 this afternoon pt left a vm returning the call to [place], Therapist, sports.  Please call pt.

## 2021-07-06 NOTE — Telephone Encounter (Signed)
I called patient. She is driving and will call me back when she is available.

## 2021-07-06 NOTE — Telephone Encounter (Signed)
I called patient to discuss. No answer, left a message asking her to call us back. If patient calls back another day please route to POD 4. ?

## 2021-07-06 NOTE — Telephone Encounter (Signed)
Pt returned phone call would like a call back. 

## 2021-07-06 NOTE — Telephone Encounter (Signed)
I called Destiny Norman. I advised Destiny Norman that Dr. Rexene Alberts reviewed their sleep study results and found that Destiny Norman needs CPAP. Dr. Rexene Alberts recommends that Destiny Norman start CPAP. I reviewed PAP compliance expectations with the Destiny Norman. Destiny Norman is agreeable to starting a CPAP. I advised Destiny Norman that an order will be sent to a DME, [employer], and they will call the Destiny Norman within about one week after they file with the Destiny Norman's insurance. [facility] will show the Destiny Norman how to use the machine, fit for masks, and troubleshoot the CPAP if needed. A follow up appt was made for insurance purposes with Dr. Rexene Alberts on 09-03-2021 at [place]. Destiny Norman verbalized understanding to arrive 15 minutes early and bring their CPAP. A letter with all of this information in it will be mailed to the Destiny Norman as a reminder. I verified with the Destiny Norman that the address we have on file is correct. Destiny Norman verbalized understanding of results. Destiny Norman had no questions at this time but was encouraged to call back if questions arise. I have sent the order to [facility]. and have received confirmation that they have received the order.

## 2021-07-06 NOTE — Telephone Encounter (Signed)
-----   Message from Star Age, MD sent at 07/02/2021  5:54 PM EST ----- Patient referred by Dr. Krista Blue, seen by me on 05/04/21, diagnostic PSG on 05/22/21. Patient had a CPAP titration study on 06/24/21.  Please call and inform patient that I have entered an order for treatment with positive airway pressure (PAP) treatment for obstructive sleep apnea (OSA). She did fairly during the latest sleep study with CPAP. We will, therefore, arrange for a machine for home use through a DME (durable medical equipment) company of Her choice; and I will see the patient back in follow-up in about 10 weeks. Please also explain to the patient that I will be looking out for compliance data, which can be downloaded from the machine (stored on an SD card, that is inserted in the machine) or via remote access through a modem, that is built into the machine. At the time of the followup appointment we will discuss sleep study results and how it is going with PAP treatment at home. Please advise patient to bring Her machine at the time of the first FU visit, even though this is cumbersome. Bringing the machine for every visit after that will likely not be needed, but often helps for the first visit to troubleshoot if needed or to get the data off the machine. Please re-enforce the importance of compliance with treatment and the need for [place] to monitor compliance data - often an insurance requirement and actually good feedback for the patient as far as how they are doing.  Also remind patient, that any interim PAP machine or mask issues should be first addressed with the DME company, as they can often help better with technical and mask fit issues. Please ask if patient has a preference regarding DME company.  Please also make sure, the patient has a follow-up appointment with me in about 10 weeks from the setup date, thanks. May see one of our nurse practitioners if needed for proper timing of the FU appointment.  Please fax or rout report  to the referring provider. Thanks,   Star Age, MD, PhD [facility])

## 2021-07-06 NOTE — Telephone Encounter (Signed)
Looks like our office tried to call pt to discuss sleep study results and she is going to call us back.

## 2021-07-06 NOTE — Telephone Encounter (Signed)
-----   Message from Star Age, MD sent at 07/02/2021  5:54 PM EST ----- Patient referred by Dr. Krista Blue, seen by me on 05/04/21, diagnostic PSG on 05/22/21. Patient had a CPAP titration study on 06/24/21.  Please call and inform patient that I have entered an order for treatment with positive airway pressure (PAP) treatment for obstructive sleep apnea (OSA). She did fairly during the latest sleep study with CPAP. We will, therefore, arrange for a machine for home use through a DME (durable medical equipment) company of Her choice; and I will see the patient back in follow-up in about 10 weeks. Please also explain to the patient that I will be looking out for compliance data, which can be downloaded from the machine (stored on an SD card, that is inserted in the machine) or via remote access through a modem, that is built into the machine. At the time of the followup appointment we will discuss sleep study results and how it is going with PAP treatment at home. Please advise patient to bring Her machine at the time of the first FU visit, even though this is cumbersome. Bringing the machine for every visit after that will likely not be needed, but often helps for the first visit to troubleshoot if needed or to get the data off the machine. Please re-enforce the importance of compliance with treatment and the need for [place] to monitor compliance data - often an insurance requirement and actually good feedback for the patient as far as how they are doing.  Also remind patient, that any interim PAP machine or mask issues should be first addressed with the DME company, as they can often help better with technical and mask fit issues. Please ask if patient has a preference regarding DME company.  Please also make sure, the patient has a follow-up appointment with me in about 10 weeks from the setup date, thanks. May see one of our nurse practitioners if needed for proper timing of the FU appointment.  Please fax or rout report  to the referring provider. Thanks,   Star Age, MD, PhD [facility])

## 2021-07-07 NOTE — Telephone Encounter (Signed)
I spoke to pt.  [employer] does not take her insurance [employer].  Nor does [employer].  Will send to [employer].  Pt will be on look out for call from them within the next week.  She appreciated call back.

## 2021-07-08 NOTE — Telephone Encounter (Signed)
Denyse Amass, RN; Vanessa Ralphs got it      Previous Messages   ----- Message -----  From: Brandon Melnick, RN  Sent: 07/07/2021  11:57 AM EST  To: Vanessa Ralphs, Marchelle Gearing, *   New cpap user in Epic.     Arvilla Market. [place]  Female, 76 y.o., 01/15/1946  MRN:  [id number]    [place]

## 2021-07-13 DIAGNOSIS — U071 COVID-19: Secondary | ICD-10-CM | POA: Diagnosis not present

## 2021-07-13 DIAGNOSIS — I10 Essential (primary) hypertension: Secondary | ICD-10-CM | POA: Diagnosis not present

## 2021-09-03 ENCOUNTER — Encounter: Payer: Self-pay | Admitting: Neurology

## 2021-09-03 ENCOUNTER — Ambulatory Visit: Payer: Medicare PPO | Admitting: Neurology

## 2021-09-03 VITALS — BP 122/61 | HR 65 | Ht 60.0 in | Wt 128.8 lb

## 2021-09-03 DIAGNOSIS — G4733 Obstructive sleep apnea (adult) (pediatric): Secondary | ICD-10-CM | POA: Diagnosis not present

## 2021-09-03 DIAGNOSIS — Z9989 Dependence on other enabling machines and devices: Secondary | ICD-10-CM | POA: Diagnosis not present

## 2021-09-03 NOTE — Patient Instructions (Signed)
It was nice to see you again today.  Glad to hear that you are benefiting from CPAP therapy and that you are fully compliant with treatment, keep up the good work!  ?Would encourage you to make a follow-up appointment with Dr. Krista Blue since you still have recurrent headaches. ?Please follow-up routinely in sleep clinic to see one of our nurse practitioners in 1 year. ?Please continue using your CPAP regularly. While your insurance requires that you use CPAP at least 4 hours each night on 70% of the nights, I recommend, that you not skip any nights and use it throughout the night if you can. Getting used to CPAP and staying with the treatment long term does take time and patience and discipline. Untreated obstructive sleep apnea when it is moderate to severe can have an adverse impact on cardiovascular health and raise her risk for heart disease, arrhythmias, hypertension, congestive heart failure, stroke and diabetes. Untreated obstructive sleep apnea causes sleep disruption, nonrestorative sleep, and sleep deprivation. This can have an impact on your day to day functioning and cause daytime sleepiness and impairment of cognitive function, memory loss, mood disturbance, and problems focussing. Using CPAP regularly can improve these symptoms. ? ?

## 2021-09-03 NOTE — Progress Notes (Signed)
Subjective:  ?  ?Patient ID: Destiny Norman is a 76 y.o. female. ? ?HPI ? ? ? ?Interim history:  ? ?Destiny Norman is a 76 year old right-handed woman with an underlying medical history, hyperlipidemia, breast cancer, vitamin D deficiency, and recurrent headaches, who presents for follow-up consultation of her obstructive sleep apnea after interim testing and starting CPAP therapy.  The patient is unaccompanied today.  I first met her at the request of Destiny Norman on 05/04/2021, at which time she reported recurrent headaches, elevated blood pressure values, and snoring.  She was advised to proceed with a sleep study.  She had a baseline sleep study, followed by a titration study.  Her baseline sleep study from 05/22/2021 showed a sleep efficiency reduced at 52.5% with a sleep latency of 14 minutes, REM latency mildly delayed at 144.5 minutes he had a significantly increased amount of wake after sleep onset hours.  Overall AHI was 21.3/h, REM AHI was 32.7/h, supine AHI 23.5/h, average oxygen saturation 96%, nadir was 87%.  She was advised to proceed with a CPAP titration study.  She had this on 06/24/2021.  Sleep efficiency was 63.5%, sleep latency 24 minutes, REM sleep was absent.  He was fitted with a small fullface mask from [employer].  She was titrated from 5 cm to 13 cm.  On the final pressure her AHI was 0/h with brief supine non-REM sleep achieved and O2 nadir of 94%. ? ?Based on her test results she was advised to start CPAP therapy at home.  Her set up date was 07/23/2021.  She has a [employer]Med air sense 10 AutoSet machine. ? ?Today, 09/03/2021: I reviewed her CPAP compliance data from 07/23/2021 through 08/21/2021, which is a total of 30 days, during which time she used her machine every night with percent use days greater than 4 hours at 100%, indicating superb compliance with an average usage of 6 hours and 42 minutes, residual AHI at goal at 1.8/h, leak on the higher side with the 95th percentile at 20.8 L/min on  a pressure of 13 cm with EPR of 3.  She reports that she is still adjusting to treatment, she feels a little better and that her sleep is more consolidated, she feels a little better rested.  She feels that she is benefiting from treatment and is certainly motivated to continue with it.  She still has headaches.  They may be just a little bit better, gabapentin 300 mg 3 times daily was too sedating, she takes 300 mg at bedtime and also has the 100 mg strength from Destiny Norman and takes it occasionally at 100 mg strength during the day.  She has not made a follow-up appointment with Destiny Norman yet and indicates that she is currently not planning to make a follow-up appointment but still reports that she is having headaches and wonders if she has cervicogenic headaches or occipital neuralgia.  She denies any shooting pains but reports that her pain started in the back of her head and when she first noticed it, she had a significant blood pressure spike.  She has not seen anybody for neck pain.  She does report some discomfort in her neck.  She reports that she has been using a chinstrap but it is uncomfortable.  She uses nasal pillows, she is wondering if she could try an under the nose fullface mask but declines a prescription at this time.  She is not quite up for renewal of her mask and will think about it.  She has mouth dryness in the mornings, she has water by the bedside.  She has not tried to increase the humidity setting quite yet.  She is able to tolerate the pressure and is fine with the ramp time.   ? ?The patient's allergies, current medications, family history, past medical history, past social history, past surgical history and problem list were reviewed and updated as appropriate.  ? ?Previously:  ? ?05/04/21: (She) reports snoring (and) recurrent headaches since April 2022.  She reports that she first woke up with her headache and had a blood pressure spike.  Her blood pressure has been under control since  then but she still has nearly daily headaches.  She has been on gabapentin which is currently 300 mg 3 times daily.  Her husband reports that she snores.  She does not wake up fully rested at times.  She does not have a family history of sleep apnea.  She does have nocturia about once or twice per average night, bedtime generally is around 10 PM and she likes to wait before falling asleep, rise time is around 9 PM.  She does not have a TV in her bedroom, currently no pets in the household.  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  She lives with her husband.  I reviewed your office note from 04/13/2021.  Her Epworth sleepiness score is 3 out of 24, fatigue severity score is 16 out of 63.  ?Of note, she had a head CT in April 2022 which showed no acute findings, she had a recent cervical spine MRI in November 2022 which showed mild degenerative changes.  Patient is wondering if her headaches are cervicogenic in nature.  She drinks caffeine in the form of coffee which is half caff, about 2 cups in the mornings. ? ?Her Past Medical History Is Significant For: ?Past Medical History:  ?Diagnosis Date  ? Breast cancer ([place])   ? High cholesterol   ? Hypertension   ? Vitamin D deficiency   ? ? ?Her Past Surgical History Is Significant For: ?Past Surgical History:  ?Procedure Laterality Date  ? CESAREAN SECTION    ? COLONOSCOPY N/A 07/04/2014  ? Procedure: COLONOSCOPY;  Surgeon: Rogene Houston, MD;  [facility];  Service: Endoscopy;  Laterality: N/A;  830  ? ESOPHAGEAL DILATION N/A 11/05/2020  ? Procedure: ESOPHAGEAL DILATION;  Surgeon: Rogene Houston, MD;  [facility];  Service: Endoscopy;  Laterality: N/A;  ? ESOPHAGOGASTRODUODENOSCOPY (EGD) WITH PROPOFOL N/A 11/05/2020  ? Procedure: ESOPHAGOGASTRODUODENOSCOPY (EGD) WITH PROPOFOL;  Surgeon: Rogene Houston, MD;  [facility];  Service: Endoscopy;  Laterality: N/A;  2:15  ? MASTECTOMY Right 2004  ? RADIOLOGY WITH  ANESTHESIA N/A 09/10/2014  ? Procedure: MRI ABDOMIN WITH AND WITHOUT;  Surgeon: Medication Radiologist, MD;  Location: [facility];  Service: Radiology;  Laterality: N/A;  ? RECONSTRUCTION BREAST W/ TRAM FLAP    ? ? ?Her Family History Is Significant For: ?Family History  ?Problem Relation Age of Onset  ? Hypertension Mother   ? Lung cancer Mother   ? Prostate cancer Father   ? Skin cancer Sister   ? Melanoma Sister   ? Cancer Brother   ? Prostate cancer Brother   ? Healthy Son   ? Sleep apnea Neg Hx   ? ? ?Her Social History Is Significant For: ?Social History  ? ?Socioeconomic History  ? Marital status: Married  ?  Spouse name: Not on file  ?  Number of children: Not on file  ? Years of education: Not on file  ? Highest education level: Not on file  ?Occupational History  ? Not on file  ?Tobacco Use  ? Smoking status: Never  ? Smokeless tobacco: Never  ?Substance and Sexual Activity  ? Alcohol use: Yes  ?  Comment: occasional glass of wine  ? Drug use: No  ? Sexual activity: Yes  ?Other Topics Concern  ? Not on file  ?Social History Narrative  ? Not on file  ? ?Social Determinants of Health  ? ?Financial Resource Strain: Not on file  ?Food Insecurity: Not on file  ?Transportation Needs: Not on file  ?Physical Activity: Not on file  ?Stress: Not on file  ?Social Connections: Not on file  ? ? ?Her Allergies Are:  ?Allergies  ?Allergen Reactions  ? Penicillins Rash  ?:  ? ?Her Current Medications Are:  ?Outpatient Encounter Medications as of 09/03/2021  ?Medication Sig  ? Cholecalciferol (VITAMIN D3) 1000 UNITS CAPS Take 1,000 Units by mouth daily.  ? gabapentin (NEURONTIN) 300 MG capsule Take 1 capsule (300 mg total) by mouth 3 (three) times daily. (Patient taking differently: Take 300 mg by mouth as needed.)  ? lisinopril-hydrochlorothiazide (ZESTORETIC) 20-25 MG tablet Take 1 tablet by mouth daily.  ? Propylene Glycol (SYSTANE BALANCE) 0.6 % SOLN Place 1 drop into both eyes every morning.  ? rosuvastatin (CRESTOR) 10 MG  tablet Take 10 mg by mouth daily.  ? Cyanocobalamin (VITAMIN B 12 PO) Take by mouth.  ? ?No facility-administered encounter medications on file as of 09/03/2021.  ?: ? ?Review of Systems:  ?Out of a complete 14

## 2021-09-09 DIAGNOSIS — G44209 Tension-type headache, unspecified, not intractable: Secondary | ICD-10-CM | POA: Diagnosis not present

## 2021-09-09 DIAGNOSIS — I10 Essential (primary) hypertension: Secondary | ICD-10-CM | POA: Diagnosis not present

## 2021-09-09 DIAGNOSIS — Z8616 Personal history of COVID-19: Secondary | ICD-10-CM | POA: Diagnosis not present

## 2021-09-09 DIAGNOSIS — M85859 Other specified disorders of bone density and structure, unspecified thigh: Secondary | ICD-10-CM | POA: Diagnosis not present

## 2021-09-09 DIAGNOSIS — M47812 Spondylosis without myelopathy or radiculopathy, cervical region: Secondary | ICD-10-CM | POA: Diagnosis not present

## 2021-09-24 ENCOUNTER — Encounter: Payer: Self-pay | Admitting: Neurology

## 2021-10-07 DIAGNOSIS — G4486 Cervicogenic headache: Secondary | ICD-10-CM | POA: Diagnosis not present

## 2021-10-07 DIAGNOSIS — M9901 Segmental and somatic dysfunction of cervical region: Secondary | ICD-10-CM | POA: Diagnosis not present

## 2021-10-07 DIAGNOSIS — M546 Pain in thoracic spine: Secondary | ICD-10-CM | POA: Diagnosis not present

## 2021-10-07 DIAGNOSIS — G44209 Tension-type headache, unspecified, not intractable: Secondary | ICD-10-CM | POA: Diagnosis not present

## 2021-10-07 DIAGNOSIS — I10 Essential (primary) hypertension: Secondary | ICD-10-CM | POA: Diagnosis not present

## 2021-10-07 DIAGNOSIS — E785 Hyperlipidemia, unspecified: Secondary | ICD-10-CM | POA: Diagnosis not present

## 2021-10-07 DIAGNOSIS — M9902 Segmental and somatic dysfunction of thoracic region: Secondary | ICD-10-CM | POA: Diagnosis not present

## 2021-10-09 DIAGNOSIS — M9901 Segmental and somatic dysfunction of cervical region: Secondary | ICD-10-CM | POA: Diagnosis not present

## 2021-10-09 DIAGNOSIS — M546 Pain in thoracic spine: Secondary | ICD-10-CM | POA: Diagnosis not present

## 2021-10-09 DIAGNOSIS — M9902 Segmental and somatic dysfunction of thoracic region: Secondary | ICD-10-CM | POA: Diagnosis not present

## 2021-10-09 DIAGNOSIS — G4486 Cervicogenic headache: Secondary | ICD-10-CM | POA: Diagnosis not present

## 2021-10-12 DIAGNOSIS — G4486 Cervicogenic headache: Secondary | ICD-10-CM | POA: Diagnosis not present

## 2021-10-12 DIAGNOSIS — M9902 Segmental and somatic dysfunction of thoracic region: Secondary | ICD-10-CM | POA: Diagnosis not present

## 2021-10-12 DIAGNOSIS — M9901 Segmental and somatic dysfunction of cervical region: Secondary | ICD-10-CM | POA: Diagnosis not present

## 2021-10-12 DIAGNOSIS — M546 Pain in thoracic spine: Secondary | ICD-10-CM | POA: Diagnosis not present

## 2021-10-14 DIAGNOSIS — M9901 Segmental and somatic dysfunction of cervical region: Secondary | ICD-10-CM | POA: Diagnosis not present

## 2021-10-14 DIAGNOSIS — M9902 Segmental and somatic dysfunction of thoracic region: Secondary | ICD-10-CM | POA: Diagnosis not present

## 2021-10-14 DIAGNOSIS — M546 Pain in thoracic spine: Secondary | ICD-10-CM | POA: Diagnosis not present

## 2021-10-14 DIAGNOSIS — G4486 Cervicogenic headache: Secondary | ICD-10-CM | POA: Diagnosis not present

## 2021-10-21 DIAGNOSIS — G4486 Cervicogenic headache: Secondary | ICD-10-CM | POA: Diagnosis not present

## 2021-10-21 DIAGNOSIS — M546 Pain in thoracic spine: Secondary | ICD-10-CM | POA: Diagnosis not present

## 2021-10-21 DIAGNOSIS — M9901 Segmental and somatic dysfunction of cervical region: Secondary | ICD-10-CM | POA: Diagnosis not present

## 2021-10-21 DIAGNOSIS — M9902 Segmental and somatic dysfunction of thoracic region: Secondary | ICD-10-CM | POA: Diagnosis not present

## 2021-10-23 DIAGNOSIS — M9902 Segmental and somatic dysfunction of thoracic region: Secondary | ICD-10-CM | POA: Diagnosis not present

## 2021-10-23 DIAGNOSIS — M9901 Segmental and somatic dysfunction of cervical region: Secondary | ICD-10-CM | POA: Diagnosis not present

## 2021-10-23 DIAGNOSIS — M546 Pain in thoracic spine: Secondary | ICD-10-CM | POA: Diagnosis not present

## 2021-10-23 DIAGNOSIS — G4486 Cervicogenic headache: Secondary | ICD-10-CM | POA: Diagnosis not present

## 2021-10-28 DIAGNOSIS — M9901 Segmental and somatic dysfunction of cervical region: Secondary | ICD-10-CM | POA: Diagnosis not present

## 2021-10-28 DIAGNOSIS — M9902 Segmental and somatic dysfunction of thoracic region: Secondary | ICD-10-CM | POA: Diagnosis not present

## 2021-10-28 DIAGNOSIS — G4486 Cervicogenic headache: Secondary | ICD-10-CM | POA: Diagnosis not present

## 2021-10-28 DIAGNOSIS — M546 Pain in thoracic spine: Secondary | ICD-10-CM | POA: Diagnosis not present

## 2021-10-30 DIAGNOSIS — G4486 Cervicogenic headache: Secondary | ICD-10-CM | POA: Diagnosis not present

## 2021-10-30 DIAGNOSIS — M546 Pain in thoracic spine: Secondary | ICD-10-CM | POA: Diagnosis not present

## 2021-10-30 DIAGNOSIS — M9902 Segmental and somatic dysfunction of thoracic region: Secondary | ICD-10-CM | POA: Diagnosis not present

## 2021-10-30 DIAGNOSIS — M9901 Segmental and somatic dysfunction of cervical region: Secondary | ICD-10-CM | POA: Diagnosis not present

## 2021-11-02 DIAGNOSIS — M9901 Segmental and somatic dysfunction of cervical region: Secondary | ICD-10-CM | POA: Diagnosis not present

## 2021-11-02 DIAGNOSIS — M9902 Segmental and somatic dysfunction of thoracic region: Secondary | ICD-10-CM | POA: Diagnosis not present

## 2021-11-02 DIAGNOSIS — G4486 Cervicogenic headache: Secondary | ICD-10-CM | POA: Diagnosis not present

## 2021-11-02 DIAGNOSIS — M546 Pain in thoracic spine: Secondary | ICD-10-CM | POA: Diagnosis not present

## 2021-11-27 DIAGNOSIS — M542 Cervicalgia: Secondary | ICD-10-CM | POA: Diagnosis not present

## 2021-11-27 DIAGNOSIS — R519 Headache, unspecified: Secondary | ICD-10-CM | POA: Diagnosis not present

## 2021-12-28 DIAGNOSIS — M5412 Radiculopathy, cervical region: Secondary | ICD-10-CM | POA: Diagnosis not present

## 2022-02-01 DIAGNOSIS — R519 Headache, unspecified: Secondary | ICD-10-CM | POA: Diagnosis not present

## 2022-02-01 DIAGNOSIS — M5412 Radiculopathy, cervical region: Secondary | ICD-10-CM | POA: Diagnosis not present

## 2022-02-01 DIAGNOSIS — M542 Cervicalgia: Secondary | ICD-10-CM | POA: Diagnosis not present

## 2022-02-22 DIAGNOSIS — Z049 Encounter for examination and observation for unspecified reason: Secondary | ICD-10-CM | POA: Diagnosis not present

## 2022-02-22 DIAGNOSIS — G4452 New daily persistent headache (NDPH): Secondary | ICD-10-CM | POA: Diagnosis not present

## 2022-02-22 DIAGNOSIS — Z79899 Other long term (current) drug therapy: Secondary | ICD-10-CM | POA: Diagnosis not present

## 2022-02-25 DIAGNOSIS — M542 Cervicalgia: Secondary | ICD-10-CM | POA: Diagnosis not present

## 2022-02-25 DIAGNOSIS — G518 Other disorders of facial nerve: Secondary | ICD-10-CM | POA: Diagnosis not present

## 2022-02-25 DIAGNOSIS — M791 Myalgia, unspecified site: Secondary | ICD-10-CM | POA: Diagnosis not present

## 2022-02-25 DIAGNOSIS — G4452 New daily persistent headache (NDPH): Secondary | ICD-10-CM | POA: Diagnosis not present

## 2022-03-10 ENCOUNTER — Ambulatory Visit: Payer: Medicare PPO | Admitting: Neurology

## 2022-03-10 DIAGNOSIS — G518 Other disorders of facial nerve: Secondary | ICD-10-CM | POA: Diagnosis not present

## 2022-03-10 DIAGNOSIS — G4452 New daily persistent headache (NDPH): Secondary | ICD-10-CM | POA: Diagnosis not present

## 2022-03-10 DIAGNOSIS — M542 Cervicalgia: Secondary | ICD-10-CM | POA: Diagnosis not present

## 2022-03-10 DIAGNOSIS — M791 Myalgia, unspecified site: Secondary | ICD-10-CM | POA: Diagnosis not present

## 2022-03-17 DIAGNOSIS — Z0000 Encounter for general adult medical examination without abnormal findings: Secondary | ICD-10-CM | POA: Diagnosis not present

## 2022-03-17 DIAGNOSIS — K219 Gastro-esophageal reflux disease without esophagitis: Secondary | ICD-10-CM | POA: Diagnosis not present

## 2022-03-17 DIAGNOSIS — Z853 Personal history of malignant neoplasm of breast: Secondary | ICD-10-CM | POA: Diagnosis not present

## 2022-03-17 DIAGNOSIS — I10 Essential (primary) hypertension: Secondary | ICD-10-CM | POA: Diagnosis not present

## 2022-03-17 DIAGNOSIS — M47812 Spondylosis without myelopathy or radiculopathy, cervical region: Secondary | ICD-10-CM | POA: Diagnosis not present

## 2022-03-17 DIAGNOSIS — E559 Vitamin D deficiency, unspecified: Secondary | ICD-10-CM | POA: Diagnosis not present

## 2022-03-17 DIAGNOSIS — N952 Postmenopausal atrophic vaginitis: Secondary | ICD-10-CM | POA: Diagnosis not present

## 2022-03-17 DIAGNOSIS — E785 Hyperlipidemia, unspecified: Secondary | ICD-10-CM | POA: Diagnosis not present

## 2022-03-17 DIAGNOSIS — Z23 Encounter for immunization: Secondary | ICD-10-CM | POA: Diagnosis not present

## 2022-03-17 DIAGNOSIS — M85859 Other specified disorders of bone density and structure, unspecified thigh: Secondary | ICD-10-CM | POA: Diagnosis not present

## 2022-03-17 DIAGNOSIS — Z1331 Encounter for screening for depression: Secondary | ICD-10-CM | POA: Diagnosis not present

## 2022-03-17 LAB — LAB REPORT - SCANNED
A1c: 6.1
EGFR: 59

## 2022-03-24 DIAGNOSIS — G4452 New daily persistent headache (NDPH): Secondary | ICD-10-CM | POA: Diagnosis not present

## 2022-03-24 DIAGNOSIS — M542 Cervicalgia: Secondary | ICD-10-CM | POA: Diagnosis not present

## 2022-03-24 DIAGNOSIS — M791 Myalgia, unspecified site: Secondary | ICD-10-CM | POA: Diagnosis not present

## 2022-03-24 DIAGNOSIS — G518 Other disorders of facial nerve: Secondary | ICD-10-CM | POA: Diagnosis not present

## 2022-04-01 DIAGNOSIS — D225 Melanocytic nevi of trunk: Secondary | ICD-10-CM | POA: Diagnosis not present

## 2022-04-01 DIAGNOSIS — Z1283 Encounter for screening for malignant neoplasm of skin: Secondary | ICD-10-CM | POA: Diagnosis not present

## 2022-04-01 DIAGNOSIS — L820 Inflamed seborrheic keratosis: Secondary | ICD-10-CM | POA: Diagnosis not present

## 2022-04-07 DIAGNOSIS — G518 Other disorders of facial nerve: Secondary | ICD-10-CM | POA: Diagnosis not present

## 2022-04-07 DIAGNOSIS — G4452 New daily persistent headache (NDPH): Secondary | ICD-10-CM | POA: Diagnosis not present

## 2022-04-07 DIAGNOSIS — M791 Myalgia, unspecified site: Secondary | ICD-10-CM | POA: Diagnosis not present

## 2022-04-07 DIAGNOSIS — M542 Cervicalgia: Secondary | ICD-10-CM | POA: Diagnosis not present

## 2022-04-13 DIAGNOSIS — R519 Headache, unspecified: Secondary | ICD-10-CM | POA: Diagnosis not present

## 2022-04-13 DIAGNOSIS — N1831 Chronic kidney disease, stage 3a: Secondary | ICD-10-CM | POA: Diagnosis not present

## 2022-04-13 DIAGNOSIS — E785 Hyperlipidemia, unspecified: Secondary | ICD-10-CM | POA: Diagnosis not present

## 2022-04-13 DIAGNOSIS — M85859 Other specified disorders of bone density and structure, unspecified thigh: Secondary | ICD-10-CM | POA: Diagnosis not present

## 2022-04-13 DIAGNOSIS — I10 Essential (primary) hypertension: Secondary | ICD-10-CM | POA: Diagnosis not present

## 2022-04-20 DIAGNOSIS — G518 Other disorders of facial nerve: Secondary | ICD-10-CM | POA: Diagnosis not present

## 2022-04-20 DIAGNOSIS — M542 Cervicalgia: Secondary | ICD-10-CM | POA: Diagnosis not present

## 2022-04-20 DIAGNOSIS — G4452 New daily persistent headache (NDPH): Secondary | ICD-10-CM | POA: Diagnosis not present

## 2022-04-20 DIAGNOSIS — M791 Myalgia, unspecified site: Secondary | ICD-10-CM | POA: Diagnosis not present

## 2022-05-05 DIAGNOSIS — M542 Cervicalgia: Secondary | ICD-10-CM | POA: Diagnosis not present

## 2022-05-05 DIAGNOSIS — M791 Myalgia, unspecified site: Secondary | ICD-10-CM | POA: Diagnosis not present

## 2022-05-05 DIAGNOSIS — G518 Other disorders of facial nerve: Secondary | ICD-10-CM | POA: Diagnosis not present

## 2022-05-05 DIAGNOSIS — G4452 New daily persistent headache (NDPH): Secondary | ICD-10-CM | POA: Diagnosis not present

## 2022-05-25 DIAGNOSIS — G4733 Obstructive sleep apnea (adult) (pediatric): Secondary | ICD-10-CM | POA: Diagnosis not present

## 2022-06-25 DIAGNOSIS — G4733 Obstructive sleep apnea (adult) (pediatric): Secondary | ICD-10-CM | POA: Diagnosis not present

## 2022-07-24 DIAGNOSIS — G4733 Obstructive sleep apnea (adult) (pediatric): Secondary | ICD-10-CM | POA: Diagnosis not present

## 2022-07-30 DIAGNOSIS — G4733 Obstructive sleep apnea (adult) (pediatric): Secondary | ICD-10-CM | POA: Diagnosis not present

## 2022-09-01 NOTE — Progress Notes (Unsigned)
PATIENT: Destiny Norman DOB: 05/22/1946  REASON FOR VISIT: follow up HISTORY FROM: patient PRIMARY NEUROLOGIST: [name]  Chief Complaint  Patient presents with   Follow-up    Pt in 5 Pt here for CPAP f.u Pt states no questions or concerns for todays visit      HISTORY OF PRESENT ILLNESS: Today 09/02/22:  Destiny Norman is a 77 y.o. female with a history of OSA on CPAP. Returns today for follow-up.  She reports that the CPAP works okay.  She states that she has not noticed the benefit.  Continues to wake up with a headache.  She states initially she was using the machine the entire night however now if she wakes up and she used it for for 5 hours she will take it off and then sleep an additional 3 hours.   Always wake up with a headache. Currently taking Gabapentin 100-200 mg in the AM and 300 mg at bedtime. Finds it helpful but it has not eliminated headaches. No headache free days. But there are days that she feels better than others.  Headache is always in the occipital region in the neck.  Feels tight and pressure sensation.  Feels that her headache started 1 to 2 years ago when she was doing yard work.  Went to headache center and saw Dr. Neale Burly- who did trigger point injections. Tried Topamax with her PCP. Reports that Dr. Neale Burly prescribed a medicine but she doesn't recall the name but she could not tolerate it.        REVIEW OF SYSTEMS: Out of a complete 14 system review of symptoms, the patient complains only of the following symptoms, and all other reviewed systems are negative.    ALLERGIES: Allergies  Allergen Reactions   Penicillins Rash    HOME MEDICATIONS: Outpatient Medications Prior to Visit  Medication Sig Dispense Refill   Cholecalciferol (VITAMIN D3) 1000 UNITS CAPS Take 1,000 Units by mouth daily.     gabapentin (NEURONTIN) 300 MG capsule Take 1 capsule (300 mg total) by mouth 3 (three) times daily. (Patient taking differently: Take  300 mg by mouth as needed.) 90 capsule 11   lisinopril-hydrochlorothiazide (ZESTORETIC) 20-25 MG tablet Take 1 tablet by mouth daily.     Propylene Glycol (SYSTANE BALANCE) 0.6 % SOLN Place 1 drop into both eyes every morning.     rosuvastatin (CRESTOR) 10 MG tablet Take 10 mg by mouth daily.     Cyanocobalamin (VITAMIN B 12 PO) Take by mouth.     No facility-administered medications prior to visit.    PAST MEDICAL HISTORY: Past Medical History:  Diagnosis Date   Breast cancer    High cholesterol    Hypertension    Vitamin D deficiency     PAST SURGICAL HISTORY: Past Surgical History:  Procedure Laterality Date   CESAREAN SECTION     COLONOSCOPY N/A 07/04/2014   Procedure: COLONOSCOPY;  Surgeon: Malissa Hippo, MD;  [facility];  Service: Endoscopy;  Laterality: N/A;  830   ESOPHAGEAL DILATION N/A 11/05/2020   Procedure: ESOPHAGEAL DILATION;  Surgeon: Malissa Hippo, MD;  [facility];  Service: Endoscopy;  Laterality: N/A;   ESOPHAGOGASTRODUODENOSCOPY (EGD) WITH PROPOFOL N/A 11/05/2020   Procedure: ESOPHAGOGASTRODUODENOSCOPY (EGD) WITH PROPOFOL;  Surgeon: Malissa Hippo, MD;  [facility];  Service: Endoscopy;  Laterality: N/A;  2:15   MASTECTOMY Right 2004   RADIOLOGY WITH ANESTHESIA N/A 09/10/2014   Procedure: MRI ABDOMIN WITH  AND WITHOUT;  Surgeon: Medication Radiologist, MD;  Location[facility];  Service: Radiology;  Laterality: N/A;   RECONSTRUCTION BREAST W/ TRAM FLAP      FAMILY HISTORY: Family History  Problem Relation Age of Onset   Hypertension Mother    Lung cancer Mother    Prostate cancer Father    Skin cancer Sister    Melanoma Sister    Cancer Brother    Prostate cancer Brother    Healthy Son    Sleep apnea Neg Hx     SOCIAL HISTORY: Social History   Socioeconomic History   Marital status: Married    Spouse name: Not on file   Number of children: Not on file   Years of education: Not on file   Highest education  level: Not on file  Occupational History   Not on file  Tobacco Use   Smoking status: Never   Smokeless tobacco: Never  Substance and Sexual Activity   Alcohol use: Yes    Comment: occasional glass of wine   Drug use: No   Sexual activity: Yes  Other Topics Concern   Not on file  Social History Narrative   Not on file   Social Determinants of Health   Financial Resource Strain: Not on file  Food Insecurity: Not on file  Transportation Needs: Not on file  Physical Activity: Not on file  Stress: Not on file  Social Connections: Not on file  Intimate Partner Violence: Not on file      PHYSICAL EXAM  Vitals:   09/02/22 1026  BP: (!) 139/58  Pulse: (!) 53  Weight: 130 lb (59 kg)  Height: 5' (1.524 m)   Body mass index is 25.39 kg/m.  Generalized: Well developed, in no acute distress  Chest: Lungs clear to auscultation bilaterally  Neurological examination  Mentation: Alert oriented to time, place, history taking. Follows all commands speech and language fluent Cranial nerve II-XII: Extraocular movements were full, visual field were full on confrontational test Head turning and shoulder shrug  were normal and symmetric. Motor: The motor testing reveals 5 over 5 strength of all 4 extremities. Good symmetric motor tone is noted throughout.  Sensory: Sensory testing is intact to soft touch on all 4 extremities. No evidence of extinction is noted.  Gait and station: Gait is normal.    DIAGNOSTIC DATA (LABS, IMAGING, TESTING) - I reviewed patient records, labs, notes, testing and imaging myself where available.  Lab Results  Component Value Date   WBC 6.5 04/13/2021   HGB 11.7 04/13/2021   HCT 36.4 04/13/2021   MCV 84 04/13/2021   PLT 253 02/14/2015      Component Value Date/Time   NA 134 (L) 10/30/2020 1053   K 3.9 10/30/2020 1053   CL 101 10/30/2020 1053   CO2 26 10/30/2020 1053   GLUCOSE 108 (H) 10/30/2020 1053   BUN 11 10/30/2020 1053   CREATININE 0.84  10/30/2020 1053   CALCIUM 9.0 10/30/2020 1053   PROT 7.4 02/14/2015 0955   ALBUMIN 4.1 02/14/2015 0955   AST 24 02/14/2015 0955   ALT 12 (L) 02/14/2015 0955   ALKPHOS 72 02/14/2015 0955   BILITOT 0.6 02/14/2015 0955   GFRNONAA >60 10/30/2020 1053   GFRAA >60 02/14/2015 0955   No results found for: "CHOL", "HDL", "LDLCALC", "LDLDIRECT", "TRIG", "CHOLHDL" No results found for: "HGBA1C" No results found for: "VITAMINB12" Lab Results  Component Value Date   TSH 2.630 04/13/2021      ASSESSMENT  AND PLAN 77 y.o. year old female  has a past medical history of Breast cancer, High cholesterol, Hypertension, and Vitamin D deficiency. here with:  OSA on CPAP  - CPAP compliance excellent - Good treatment of AHI  - Encourage patient to use CPAP nightly and > 4 hours each night  2.  Daily persistent headache  -Start nortriptyline 10 mg at bedtime.  I reviewed potential side effects with the patient and provided her information on her after visit summary. -Decrease gabapentin to 100-200 milligrams in the AM. -Will do an overnight pulse oximetry test to verify that her oxygen level is remaining stable while on CPAP.   - F/U in 3 to 4 months or sooner if needed    Butch PennyMegan Millikan, MSN, NP-C 09/02/2022, 10:31 AM [facility] [address], [address] [place], [place] [address] [phone]

## 2022-09-02 ENCOUNTER — Ambulatory Visit: Payer: Medicare PPO | Admitting: Adult Health

## 2022-09-02 ENCOUNTER — Encounter: Payer: Self-pay | Admitting: Adult Health

## 2022-09-02 VITALS — BP 139/58 | HR 53 | Ht 60.0 in | Wt 130.0 lb

## 2022-09-02 DIAGNOSIS — R519 Headache, unspecified: Secondary | ICD-10-CM

## 2022-09-02 DIAGNOSIS — G4452 New daily persistent headache (NDPH): Secondary | ICD-10-CM

## 2022-09-02 DIAGNOSIS — G4733 Obstructive sleep apnea (adult) (pediatric): Secondary | ICD-10-CM

## 2022-09-02 MED ORDER — GABAPENTIN 100 MG PO CAPS: ORAL_CAPSULE | ORAL | 11 refills | Status: DC

## 2022-09-02 MED ORDER — NORTRIPTYLINE HCL 10 MG PO CAPS: 10.0000 mg | ORAL_CAPSULE | Freq: Every day | ORAL | 3 refills | Status: DC

## 2022-09-02 NOTE — Patient Instructions (Addendum)
Continue using CPAP nightly and greater than 4 hours each night Continue Gabapentin 100-200 in the AM only Start Nortriptyline 10 mg at bedtime Order sent for pulse oximetry test to measure oxygen level If your symptoms worsen or you develop new symptoms please let us know.

## 2022-09-06 NOTE — Progress Notes (Signed)
Sharyne Richters, RN; Ellis Parents Fabian Sharp; Santina Evans; Marveen Reeks Received, Thanks     Previous Messages    ----- Message ----- From: Guy Begin, RN Sent: 09/06/2022   1:29 PM EDT To: Penni Homans; Santina Evans; * Subject: overnight pulse ox while on cpap              New order in [employer] C. Standlee Female, 77 y.o., [birth year] MRN: [id number]   Thank you

## 2022-09-15 DIAGNOSIS — R0683 Snoring: Secondary | ICD-10-CM | POA: Diagnosis not present

## 2022-09-15 DIAGNOSIS — G4730 Sleep apnea, unspecified: Secondary | ICD-10-CM | POA: Diagnosis not present

## 2022-09-23 ENCOUNTER — Telehealth: Payer: Self-pay | Admitting: *Deleted

## 2022-09-23 NOTE — Telephone Encounter (Signed)
ONO was normal.

## 2022-09-23 NOTE — Telephone Encounter (Signed)
ONO results received from [employer] [facility]. Results placed on Megan NP's desk.

## 2022-10-14 ENCOUNTER — Ambulatory Visit: Payer: Medicare PPO | Admitting: Family Medicine

## 2022-10-14 ENCOUNTER — Encounter: Payer: Self-pay | Admitting: Family Medicine

## 2022-10-14 VITALS — BP 138/81 | HR 53 | Temp 97.7°F | Ht 60.0 in | Wt 127.0 lb

## 2022-10-14 DIAGNOSIS — G4733 Obstructive sleep apnea (adult) (pediatric): Secondary | ICD-10-CM | POA: Diagnosis not present

## 2022-10-14 DIAGNOSIS — R7303 Prediabetes: Secondary | ICD-10-CM | POA: Diagnosis not present

## 2022-10-14 DIAGNOSIS — I10 Essential (primary) hypertension: Secondary | ICD-10-CM

## 2022-10-14 DIAGNOSIS — Z780 Asymptomatic menopausal state: Secondary | ICD-10-CM | POA: Diagnosis not present

## 2022-10-14 DIAGNOSIS — Z130 Encounter for screening for diseases of the blood and blood-forming organs and certain disorders involving the immune mechanism: Secondary | ICD-10-CM | POA: Diagnosis not present

## 2022-10-14 DIAGNOSIS — E782 Mixed hyperlipidemia: Secondary | ICD-10-CM

## 2022-10-14 DIAGNOSIS — R519 Headache, unspecified: Secondary | ICD-10-CM

## 2022-10-14 MED ORDER — GABAPENTIN 300 MG PO CAPS: 300.0000 mg | ORAL_CAPSULE | Freq: Every day | ORAL | 3 refills | Status: DC

## 2022-10-14 NOTE — Assessment & Plan Note (Signed)
Well controlled. Continue current medications  

## 2022-10-14 NOTE — Progress Notes (Signed)
Subjective:  Patient ID: Destiny Norman, female    DOB: October 23, 1945  Age: 77 y.o. MRN: [id number]  CC: Chief Complaint  Patient presents with   Establish Care    HPI:  77 year old female with hypertension, hyperlipidemia, chronic occipital headache, prediabetes, OSA who presents to establish care.  Patient states that overall she is doing well.  Her main issue is the headaches that she has.  These occur fairly frequently.  Located in the occipital region.  She has been seen by headache wellness center as well as neurology.  She was diagnosed with sleep apnea and this was thought to be the major contributing factor for her headaches.  She is compliant with CPAP but this has not resolved her headaches.  She is on gabapentin and nortriptyline regarding this.  Patient's hypertension is stable on metoprolol and lisinopril/HCTZ.  Patient states that she is in need of DEXA scan.  Patient Active Problem List   Diagnosis Date Noted   Occipital headache 10/14/2022   OSA on CPAP 10/14/2022   Essential hypertension 12/23/2020   Mixed hyperlipidemia 12/23/2020   Prediabetes 12/23/2020    Social Hx   Social History   Socioeconomic History   Marital status: Married    Spouse name: Not on file   Number of children: Not on file   Years of education: Not on file   Highest education level: Not on file  Occupational History   Not on file  Tobacco Use   Smoking status: Never   Smokeless tobacco: Never  Substance and Sexual Activity   Alcohol use: Yes   Drug use: No   Sexual activity: Yes  Other Topics Concern   Not on file  Social History Narrative   Not on file   Social Determinants of Health   Financial Resource Strain: Not on file  Food Insecurity: Not on file  Transportation Needs: Not on file  Physical Activity: Not on file  Stress: Not on file  Social Connections: Not on file    Review of Systems Per HPI  Objective:  BP 138/81   Pulse (!) 53   Temp 97.7 F  (36.5 C)   Ht 5' (1.524 m)   Wt 127 lb (57.6 kg)   SpO2 99%   BMI 24.80 kg/m      10/14/2022    9:41 AM 10/14/2022    9:21 AM 09/02/2022   10:26 AM  BP/Weight  Systolic BP 138 150 139  Diastolic BP 81 71 58  Wt. (Lbs)  127 130  BMI  24.8 kg/m2 25.39 kg/m2    Physical Exam Vitals and nursing note reviewed.  Constitutional:      General: She is not in acute distress.    Appearance: Normal appearance.  HENT:     Head: Normocephalic and atraumatic.  Cardiovascular:     Rate and Rhythm: Normal rate and regular rhythm.  Pulmonary:     Effort: Pulmonary effort is normal.     Breath sounds: Normal breath sounds. No wheezing, rhonchi or rales.  Neurological:     General: No focal deficit present.     Mental Status: She is alert.  Psychiatric:        Mood and Affect: Mood normal.        Behavior: Behavior normal.     Lab Results  Component Value Date   WBC 6.5 04/13/2021   HGB 11.7 04/13/2021   HCT 36.4 04/13/2021   PLT 253 02/14/2015   GLUCOSE 108 (H) 10/30/2020  ALT 12 (L) 02/14/2015   AST 24 02/14/2015   NA 134 (L) 10/30/2020   K 3.9 10/30/2020   CL 101 10/30/2020   CREATININE 0.84 10/30/2020   BUN 11 10/30/2020   CO2 26 10/30/2020   TSH 2.630 04/13/2021     Assessment & Plan:   Problem List Items Addressed This Visit       Cardiovascular and Mediastinum   Essential hypertension    Well-controlled.  Continue current medications.      Relevant Medications   metoprolol succinate (TOPROL XL) 25 MG 24 hr tablet   lisinopril-hydrochlorothiazide (ZESTORETIC) 10-12.5 MG tablet   Other Relevant Orders   CMP14+EGFR     Respiratory   OSA on CPAP    Stable.  Compliant with CPAP.        Other   Mixed hyperlipidemia   Relevant Medications   metoprolol succinate (TOPROL XL) 25 MG 24 hr tablet   lisinopril-hydrochlorothiazide (ZESTORETIC) 10-12.5 MG tablet   Other Relevant Orders   Lipid panel   Occipital headache - Primary    Increasing gabapentin  for better control.      Relevant Medications   metoprolol succinate (TOPROL XL) 25 MG 24 hr tablet   gabapentin (NEURONTIN) 300 MG capsule   Prediabetes   Relevant Orders   Hemoglobin A1c   Other Visit Diagnoses     Post-menopausal       Relevant Orders   DG Bone Density   Screening for deficiency anemia       Relevant Orders   CBC       Meds ordered this encounter  Medications   gabapentin (NEURONTIN) 300 MG capsule    Sig: Take 1 capsule (300 mg total) by mouth at bedtime.    Dispense:  90 capsule    Refill:  3    Follow-up:  Return in about 6 months (around 04/16/2023).  Everlene Other DO [facility]

## 2022-10-14 NOTE — Assessment & Plan Note (Signed)
Stable. Compliant with CPAP

## 2022-10-14 NOTE — Patient Instructions (Signed)
Medication as directed.  Labs and Bone Density ordered.  Follow up in 6 months.

## 2022-10-14 NOTE — Assessment & Plan Note (Signed)
Increasing gabapentin for better control.

## 2022-10-15 LAB — CMP14+EGFR
ALT: 10 IU/L (ref 0–32)
AST: 19 IU/L (ref 0–40)
Albumin/Globulin Ratio: 1.8 (ref 1.2–2.2)
Albumin: 4.5 g/dL (ref 3.8–4.8)
Alkaline Phosphatase: 104 IU/L (ref 44–121)
BUN/Creatinine Ratio: 10 — ABNORMAL LOW (ref 12–28)
BUN: 11 mg/dL (ref 8–27)
Bilirubin Total: 0.2 mg/dL (ref 0.0–1.2)
CO2: 24 mmol/L (ref 20–29)
Calcium: 9.1 mg/dL (ref 8.7–10.3)
Chloride: 99 mmol/L (ref 96–106)
Creatinine, Ser: 1.06 mg/dL — ABNORMAL HIGH (ref 0.57–1.00)
Globulin, Total: 2.5 g/dL (ref 1.5–4.5)
Glucose: 100 mg/dL — ABNORMAL HIGH (ref 70–99)
Potassium: 4.7 mmol/L (ref 3.5–5.2)
Sodium: 138 mmol/L (ref 134–144)
Total Protein: 7 g/dL (ref 6.0–8.5)
eGFR: 55 mL/min/{1.73_m2} — ABNORMAL LOW (ref >59)

## 2022-10-15 LAB — LIPID PANEL
Chol/HDL Ratio: 4 ratio (ref 0.0–4.4)
Cholesterol, Total: 159 mg/dL (ref 100–199)
HDL: 40 mg/dL (ref >39)
LDL Chol Calc (NIH): 79 mg/dL (ref 0–99)
Triglycerides: 242 mg/dL — ABNORMAL HIGH (ref 0–149)
VLDL Cholesterol Cal: 40 mg/dL (ref 5–40)

## 2022-10-15 LAB — CBC
Hematocrit: 34 % (ref 34.0–46.6)
Hemoglobin: 11 g/dL — ABNORMAL LOW (ref 11.1–15.9)
MCH: 26.8 pg (ref 26.6–33.0)
MCHC: 32.4 g/dL (ref 31.5–35.7)
MCV: 83 fL (ref 79–97)
Platelets: 321 10*3/uL (ref 150–450)
RBC: 4.1 x10E6/uL (ref 3.77–5.28)
RDW: 14.3 % (ref 11.7–15.4)
WBC: 6.8 10*3/uL (ref 3.4–10.8)

## 2022-10-15 LAB — HEMOGLOBIN A1C
Est. average glucose Bld gHb Est-mCnc: 134 mg/dL
Hgb A1c MFr Bld: 6.3 % — ABNORMAL HIGH (ref 4.8–5.6)

## 2022-10-16 IMAGING — CT CT HEAD W/O CM
3 series · 16 of 47 positions shown, 19 images · non-contrast
Comparison: None.

CLINICAL DATA: Worsening headache, intermittent numbness around
base of skull.

EXAM:
CT HEAD WITHOUT CONTRAST
TECHNIQUE: Contiguous axial images were obtained from the base of the skull
through the vertex without intravenous contrast.

[Series 2: head w o · axial · 0.42mm/px · z∈[+1424,+1549]mm · 10 of 31 slices shown, 13 images]
[im 3/31  brain]
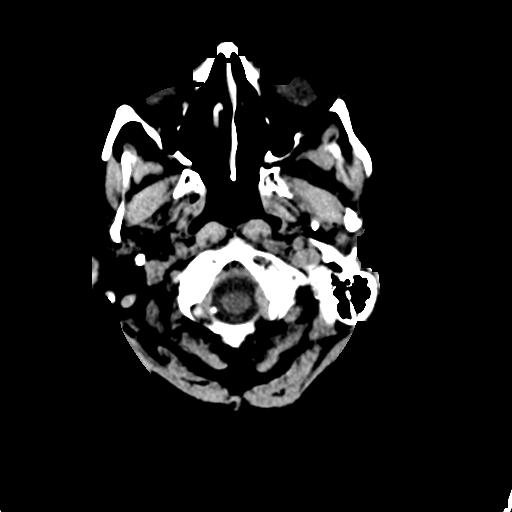
[im 3/31  bone]
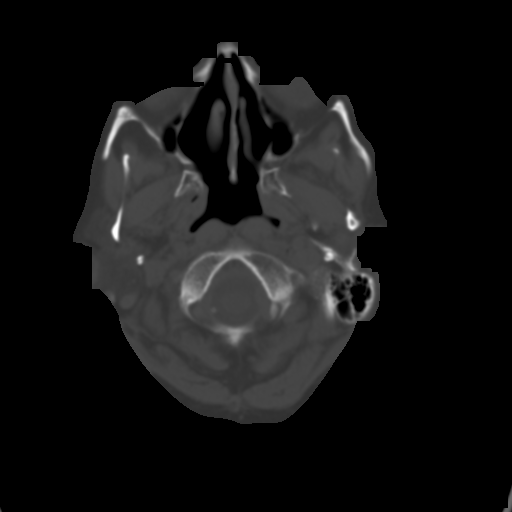
[im 6/31  brain]
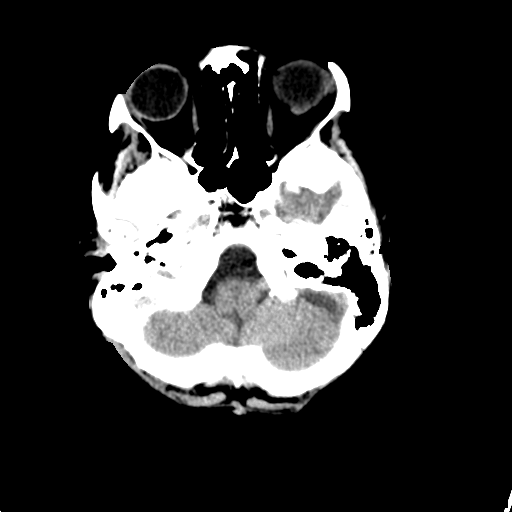
[im 9/31  brain]
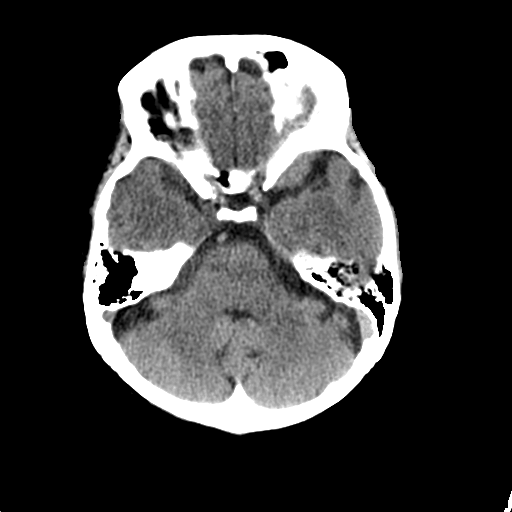
[im 11/31  brain]
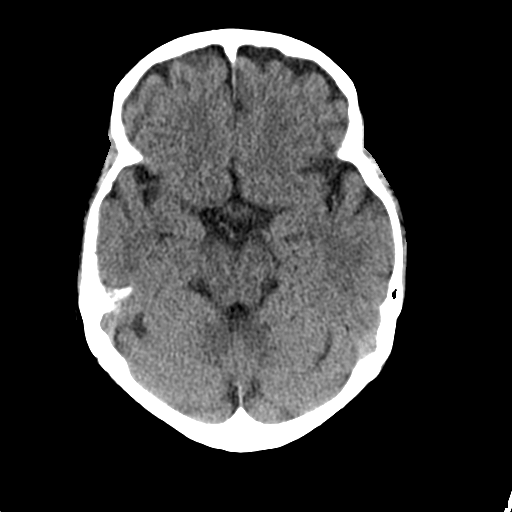
[im 14/31  brain]
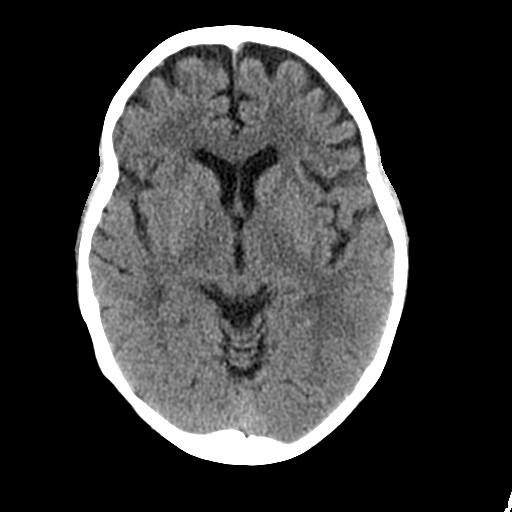
[im 14/31  bone]
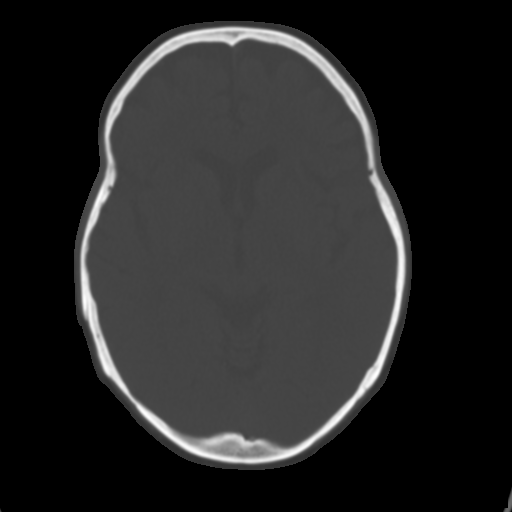
[im 17/31  brain]
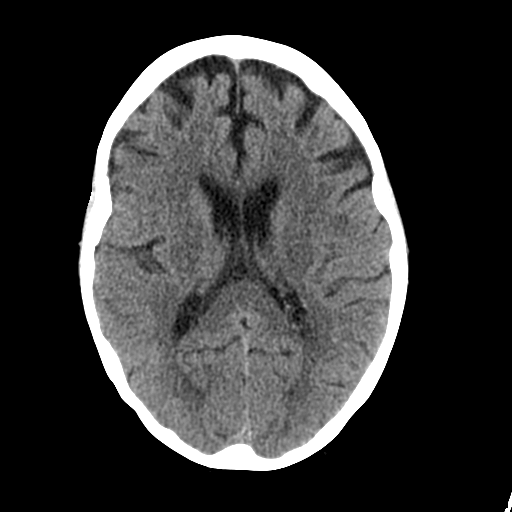
[im 20/31  brain]
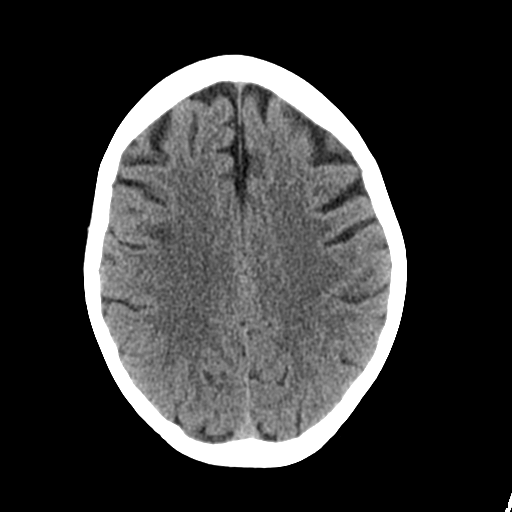
[im 23/31  brain]
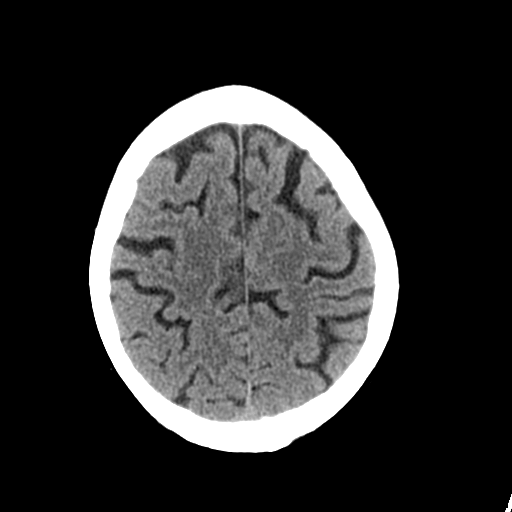
[im 25/31  brain]
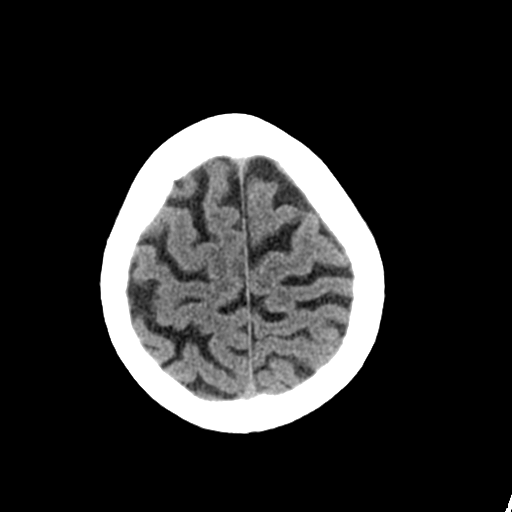
[im 25/31  bone]
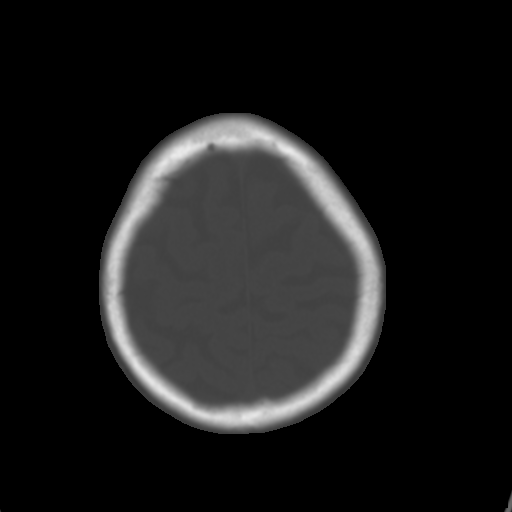
[im 28/31  brain]
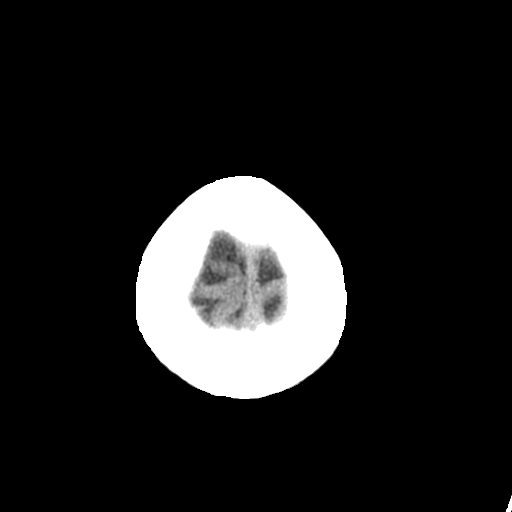

[Series 4: coronal soft · coronal · 0.32mm/px · 3 of 67 slices shown]
[im 23/67  brain]
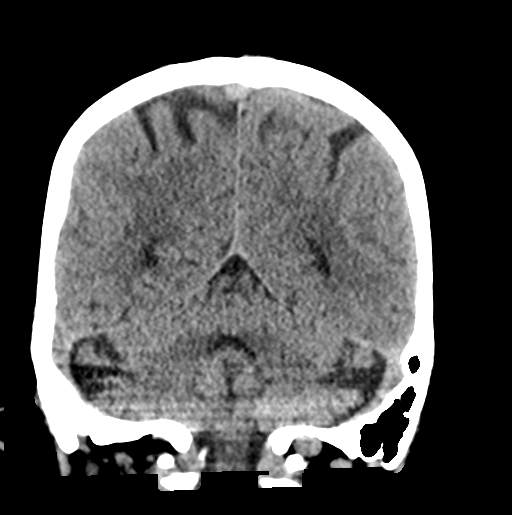
[im 30/67  brain]
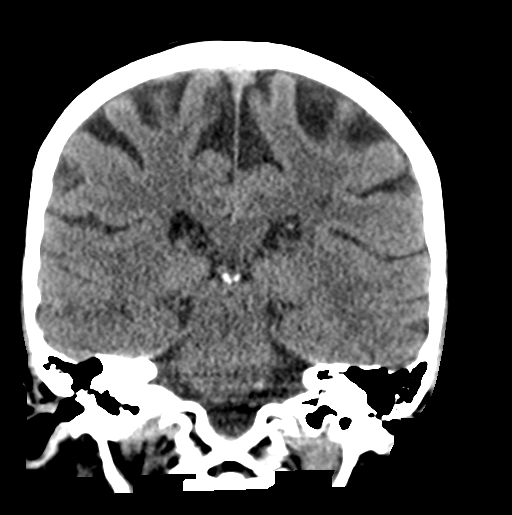
[im 37/67  brain]
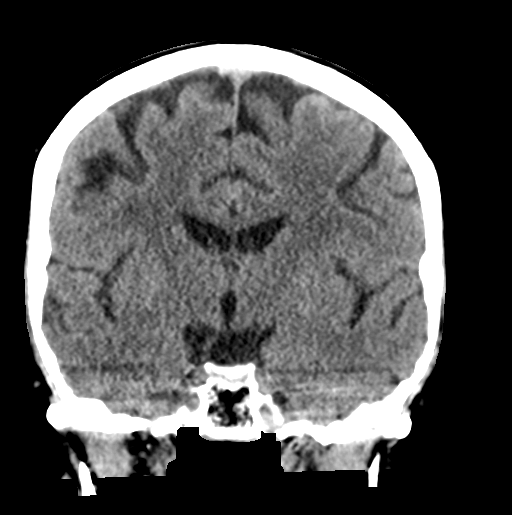

[Series 5: sagittal soft · sagittal · 0.31mm/px · 3 of 51 slices shown]
[im 17/51  brain]
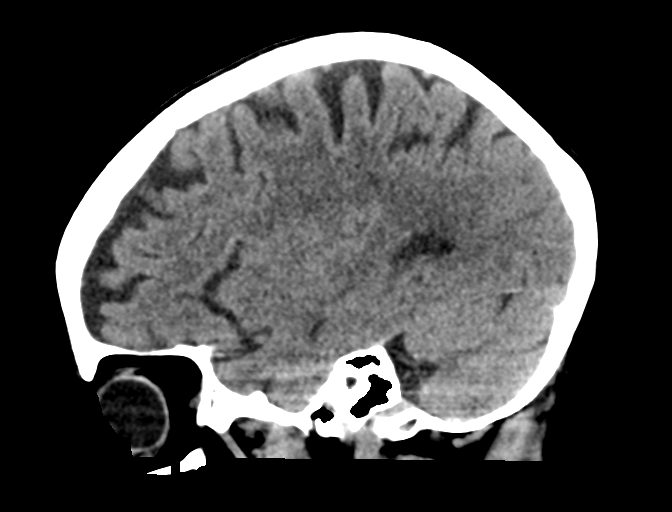
[im 26/51  brain]
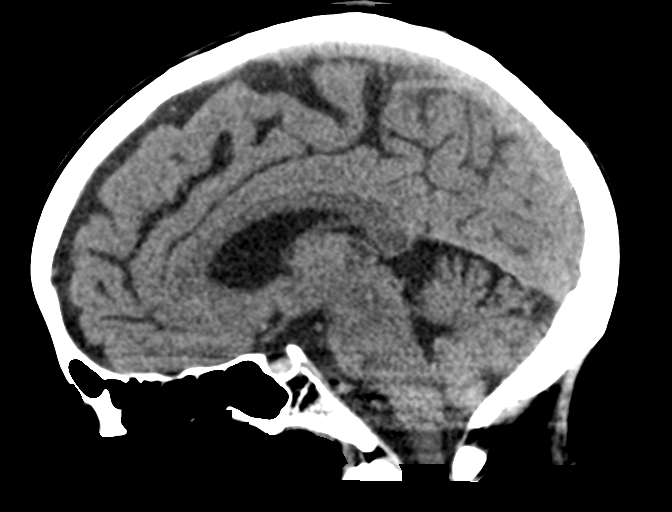
[im 34/51  brain]
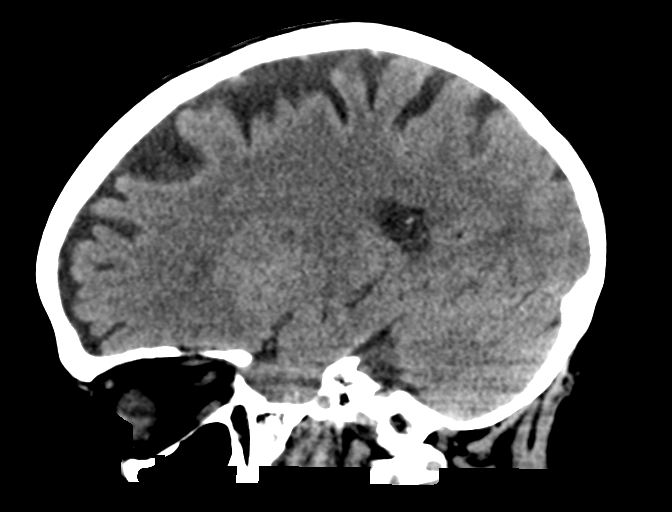

[16 of 47 positions shown; findings below may reference images not displayed]

FINDINGS: Brain: No acute intracranial abnormality. Specifically, no
hemorrhage, hydrocephalus, mass lesion, acute infarction, or
significant intracranial injury.

Vascular: No hyperdense vessel or unexpected calcification.

Skull: No acute calvarial abnormality.

Sinuses/Orbits: No acute findings

Other: None
IMPRESSION: No acute intracranial abnormality.

## 2022-10-20 ENCOUNTER — Other Ambulatory Visit: Payer: Self-pay | Admitting: Family Medicine

## 2022-10-20 DIAGNOSIS — D649 Anemia, unspecified: Secondary | ICD-10-CM

## 2022-10-22 DIAGNOSIS — D649 Anemia, unspecified: Secondary | ICD-10-CM | POA: Diagnosis not present

## 2022-10-23 ENCOUNTER — Other Ambulatory Visit: Payer: Self-pay | Admitting: Family Medicine

## 2022-10-23 LAB — VITAMIN B12: Vitamin B-12: 255 pg/mL (ref 232–1245)

## 2022-10-23 LAB — FOLATE: Folate: 18.4 ng/mL (ref >3.0)

## 2022-10-23 LAB — IRON,TIBC AND FERRITIN PANEL
Ferritin: 13 ng/mL — ABNORMAL LOW (ref 15–150)
Iron Saturation: 10 % — ABNORMAL LOW (ref 15–55)
Iron: 39 ug/dL (ref 27–139)
Total Iron Binding Capacity: 394 ug/dL (ref 250–450)
UIBC: 355 ug/dL (ref 118–369)

## 2022-10-23 MED ORDER — VITAMIN B-12 1000 MCG PO TABS: 1000.0000 ug | ORAL_TABLET | Freq: Every day | ORAL | 1 refills | Status: AC

## 2022-10-23 MED ORDER — IRON (FERROUS SULFATE) 325 (65 FE) MG PO TABS: 325.0000 mg | ORAL_TABLET | ORAL | 1 refills | Status: AC

## 2022-10-25 ENCOUNTER — Telehealth: Payer: Self-pay

## 2022-10-25 NOTE — Telephone Encounter (Signed)
Patient was informed of her results , she stated that in the past attempts at colonoscopy were not successful due to very twisted bowels, she ended having to do a virtual colonoscopy. 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

## 2022-10-26 ENCOUNTER — Other Ambulatory Visit: Payer: Self-pay

## 2022-10-26 DIAGNOSIS — D649 Anemia, unspecified: Secondary | ICD-10-CM

## 2022-10-26 NOTE — Telephone Encounter (Signed)
Patient made aware per drs recommendations. Referral placed

## 2022-10-29 DIAGNOSIS — G4733 Obstructive sleep apnea (adult) (pediatric): Secondary | ICD-10-CM | POA: Diagnosis not present

## 2022-11-05 NOTE — Progress Notes (Unsigned)
Referring Provider: Tommie Sams, DO  Primary Care Physician:  Tommie Sams, DO Primary Gastroenterologist:  Dr. Levon Hedger  Chief Complaint  Patient presents with   Gastroesophageal Reflux    Has had some heart burn and has a hiatal hernia     HPI:   Destiny Norman is a 77 y.o. female presenting today at the request of Tommie Sams, DO for normocytic anemia.  Patient had labs completed 10/14/2022 that showed hemoglobin 11.0 with normocytic indices.  Creatinine slightly elevated at 1.06.  On 5/31, she was found to have low ferritin of 13, low iron saturation of 10%, and low normal B12 at 255.  Recommended starting iron and B12 supplementation and referring to GI.  Per telephone call 6/3, patient reported unsuccessful attempts to prior colonoscopies due to "twisted bowels" stating she had to do a virtual colonoscopy previously.  Colonoscopy last attempted February 2016 by Dr. Karilyn Cota which was incomplete to distal transverse colon secondary to very tortuous sigmoid colon and splenic flexure.  She completed virtual colonoscopy 08/06/2014 which did not show any polyps.  Last EGD 11/05/2020 with 2 cm hiatal hernia, otherwise normal exam.  Empiric esophageal dilation was performed.  Today:  Rare heartburn. No dysphagia, abdominal pain, melena, or rectal bleeding. Stools are now dark since starting iron at the end of May. Taking iron every other day, but this is causing constipation. Has never has BM daily. Usually BMs every 2-3 days at baseline. No hard stools. Has taken MiraLAX as needed which has helped.   No dysphagia.   Doesn't eat meat. States her diet isn't that great and she probably doesn't consume enough iron right foods on a regular basis.   No unintentional weight loss.   Has a persistent headache. Takes ibuprofen several days a week.    Past Medical History:  Diagnosis Date   Breast cancer (HCC)    High cholesterol    Hypertension    Vitamin D deficiency      Past Surgical History:  Procedure Laterality Date   CESAREAN SECTION     COLONOSCOPY N/A 07/04/2014   Surgeon: Malissa Hippo, MD; incomplete to distal transverse colon secondary to very tortuous sigmoid colon and splenic flexure.  She completed virtual colonoscopy 08/06/2014 which did not show any polyps.   ESOPHAGEAL DILATION N/A 11/05/2020   Procedure: ESOPHAGEAL DILATION;  Surgeon: Malissa Hippo, MD;  [facility];  Service: Endoscopy;  Laterality: N/A;   ESOPHAGOGASTRODUODENOSCOPY (EGD) WITH PROPOFOL N/A 11/05/2020   Surgeon: Malissa Hippo, MD; 2 cm hiatal hernia, otherwise normal exam.  Empiric esophageal dilation was performed.   MASTECTOMY Right 05/24/2002   RADIOLOGY WITH ANESTHESIA N/A 09/10/2014   Procedure: MRI ABDOMIN WITH AND WITHOUT;  Surgeon: Medication Radiologist, MD;  Location: [facility];  Service: Radiology;  Laterality: N/A;   RECONSTRUCTION BREAST W/ TRAM FLAP      Current Outpatient Medications  Medication Sig Dispense Refill   Cholecalciferol (VITAMIN D3) 1000 UNITS CAPS Take 1,000 Units by mouth daily.     gabapentin (NEURONTIN) 100 MG capsule Take 100-200 mg in the AM 60 capsule 11   gabapentin (NEURONTIN) 300 MG capsule Take 1 capsule (300 mg total) by mouth at bedtime. 90 capsule 3   Iron, Ferrous Sulfate, 325 (65 Fe) MG TABS Take 325 mg by mouth every other day. 45 tablet 1   lisinopril-hydrochlorothiazide (ZESTORETIC) 10-12.5 MG tablet Take 1 tablet by mouth daily.     metoprolol succinate (TOPROL XL)  25 MG 24 hr tablet Take 25 mg by mouth daily.     nortriptyline (PAMELOR) 10 MG capsule Take 1 capsule (10 mg total) by mouth at bedtime. 30 capsule 3   omeprazole (PRILOSEC) 20 MG capsule Take 1 capsule (20 mg total) by mouth daily before breakfast. 90 capsule 1   rosuvastatin (CRESTOR) 10 MG tablet Take 10 mg by mouth daily.     cyanocobalamin (VITAMIN B12) 1000 MCG tablet Take 1 tablet (1,000 mcg total) by mouth daily. (Patient not taking:  Reported on 11/08/2022) 90 tablet 1   No current facility-administered medications for this visit.    Allergies as of 11/08/2022 - Review Complete 10/14/2022  Allergen Reaction Noted   Penicillins Rash 01/04/2014    Family History  Problem Relation Age of Onset   Hypertension Mother    Lung cancer Mother    Prostate cancer Father    Skin cancer Sister    Melanoma Sister    Cancer Brother    Prostate cancer Brother    Healthy Son    Sleep apnea Neg Hx     Social History   Socioeconomic History   Marital status: Married    Spouse name: Not on file   Number of children: Not on file   Years of education: Not on file   Highest education level: Not on file  Occupational History   Not on file  Tobacco Use   Smoking status: Never   Smokeless tobacco: Never  Substance and Sexual Activity   Alcohol use: Yes   Drug use: No   Sexual activity: Yes  Other Topics Concern   Not on file  Social History Narrative   Not on file   Social Determinants of Health   Financial Resource Strain: Not on file  Food Insecurity: Not on file  Transportation Needs: Not on file  Physical Activity: Not on file  Stress: Not on file  Social Connections: Not on file  Intimate Partner Violence: Not on file    Review of Systems: Gen: Denies any fever, chills, fatigue, cold or flulike symptoms, presyncope, syncope. CV: Denies chest pain, heart palpitations, peripheral edema, syncope.  Resp: Denies shortness of breath at rest, cough. GI: See HPI GU : Denies urinary burning, urinary frequency, urinary hesitancy MS: Denies joint pain. Derm: Denies rash. Psych: Denies depression, anxiety. Heme:] See HPI  Physical Exam: BP 138/75 (BP Location: Right Arm, Patient Position: Sitting, Cuff Size: Normal)   Pulse 65   Temp 98 F (36.7 C) (Temporal)   Ht 5' (1.524 m)   Wt 128 lb (58.1 kg)   SpO2 98%   BMI 25.00 kg/m  General:   Alert and oriented. Pleasant and cooperative. Well-nourished and  well-developed.  Head:  Normocephalic and atraumatic. Eyes:  Without icterus, sclera clear and conjunctiva pink.  Ears:  Normal auditory acuity. Lungs:  Clear to auscultation bilaterally. No wheezes, rales, or rhonchi. No distress.  Heart:  S1, S2 present without murmurs appreciated.  Abdomen:  +BS, soft, non-tender and non-distended. No HSM noted. No guarding or rebound. No masses appreciated.  Rectal:  Deferred  Msk:  Symmetrical without gross deformities. Normal posture. Extremities:  Without edema. Neurologic:  Alert and  oriented x4;  grossly normal neurologically. Skin:  Intact without significant lesions or rashes. Psych:  Normal mood and affect.    Assessment:  77 year old female with history of breast cancer, HTN, HLD, presenting today at the request of Dr. Adriana Simas for anemia.  She was found to be  mildly anemic with hemoglobin of 11.0 with component of iron deficiency as well as low normal B12 in May 2024.  She has since started iron supplementation and is planning to start B12 supplementation as well as recommended by her primary care provider.  She denies overt GI bleeding.  No significant upper GI symptoms.  She does have chronic history of mild constipation that is being worsened by oral iron.  No unintentional weight loss.  She does take ibuprofen several days a week due to chronic history of persistent headache.  She is not on a PPI.  Last EGD June 2022 with 2 cm hiatal hernia, otherwise normal exam.  Colonoscopy attempted in February 2016  by Dr. Karilyn Cota which was incomplete to distal transverse colon secondary to very tortuous sigmoid colon and splenic flexure.  She completed virtual colonoscopy 08/06/2014 which did not show any polyps.    We discussed scheduling EGD and colonoscopy to further evaluate her iron deficiency anemia, but patient is requesting to hold off on this.  She suspects her iron deficiency is secondary to being a vegetarian and likely not consuming enough iron on a  regular basis.  She would like to work on her diet and see if her iron and hemoglobin normalize.  I did recommend iFOBT to evaluate for occult rectal bleeding and also recommended starting low-dose PPI for GI prophylaxis in setting of chronic NSAID use.  Of note, if colonoscopy is considered in the future, would need to discuss with Dr. Levon Hedger whether or not this is appropriate in the setting of previously failed attempt due to tortuous colon.  For constipation, advised to take Colace 100 mg with max of 300 mg daily or MiraLAX 17 g daily.   Plan:  Holding off on EGD and colonoscopy per patient's request. iFOBT Increase iron rich foods.  Separate handout provided. Continue oral iron every other day for now. Start omeprazole 20 mg daily. Start Colace 100 mg daily.  Can increase to max of 300 mg daily if needed. If Colace is not helpful, can take MiraLAX 17 g daily. Follow-up in 3 months or sooner if needed.   Ermalinda Memos, PA-C [facility] Gastroenterology 11/08/2022    I have reviewed the note and agree with the APP's assessment as described in this progress note  Katrinka Blazing, MD Gastroenterology and Hepatology [facility] Gastroenterology

## 2022-11-08 ENCOUNTER — Ambulatory Visit: Payer: Medicare PPO | Admitting: Gastroenterology

## 2022-11-08 ENCOUNTER — Encounter: Payer: Self-pay | Admitting: Gastroenterology

## 2022-11-08 ENCOUNTER — Encounter: Payer: Self-pay | Admitting: Adult Health

## 2022-11-08 VITALS — BP 138/75 | HR 65 | Temp 98.0°F | Ht 60.0 in | Wt 128.0 lb

## 2022-11-08 DIAGNOSIS — Z791 Long term (current) use of non-steroidal anti-inflammatories (NSAID): Secondary | ICD-10-CM | POA: Diagnosis not present

## 2022-11-08 DIAGNOSIS — D509 Iron deficiency anemia, unspecified: Secondary | ICD-10-CM | POA: Diagnosis not present

## 2022-11-08 DIAGNOSIS — Q438 Other specified congenital malformations of intestine: Secondary | ICD-10-CM

## 2022-11-08 MED ORDER — OMEPRAZOLE 20 MG PO CPDR
20.0000 mg | DELAYED_RELEASE_CAPSULE | Freq: Every day | ORAL | 1 refills | Status: DC
Start: 2022-11-08 — End: 2023-02-07

## 2022-11-08 NOTE — Patient Instructions (Addendum)
Please complete iFOBT and return to our office.  This is to check for blood in your stool.  Start omeprazole 20 mg once daily, first thing in the morning.  This is to decrease the acid in your stomach and protect your stomach from developing inflammation and ulceration due to the ibuprofen that you take routinely for headaches.  I have sent a prescription to your pharmacy.  For constipation, you can take Colace (docusate sodium) 100 mg daily.  You can increase this to a maximum of 300 mg/day.  Or, you can take MiraLAX 1 capful (17 g) daily in 8 ounces of water or other noncarbonated beverage.  Try to increase your diet to contain iron rich foods.  See separate attachment for this.   Continue taking your iron pill every other day for now.  We will hold off on an upper endoscopy and colonoscopy for now as you requested.  We will plan to see back in the office in about 3 months.  Do not hesitate to call sooner if you have questions or concerns.  It was very nice to meet you today!  Ermalinda Memos, PA-C [facility] Gastroenterology

## 2022-11-09 ENCOUNTER — Ambulatory Visit (INDEPENDENT_AMBULATORY_CARE_PROVIDER_SITE_OTHER): Payer: Medicare PPO | Admitting: Gastroenterology

## 2022-11-09 ENCOUNTER — Other Ambulatory Visit: Payer: Self-pay | Admitting: *Deleted

## 2022-11-09 ENCOUNTER — Encounter: Payer: Self-pay | Admitting: *Deleted

## 2022-11-09 ENCOUNTER — Ambulatory Visit (HOSPITAL_COMMUNITY)
Admission: RE | Admit: 2022-11-09 | Discharge: 2022-11-09 | Disposition: A | Discharge status: Home / Self Care | Payer: Medicare PPO | Source: Ambulatory Visit | Attending: Family Medicine | Admitting: Family Medicine

## 2022-11-09 DIAGNOSIS — Z780 Asymptomatic menopausal state: Secondary | ICD-10-CM | POA: Diagnosis not present

## 2022-11-09 DIAGNOSIS — M8589 Other specified disorders of bone density and structure, multiple sites: Secondary | ICD-10-CM | POA: Diagnosis not present

## 2022-11-09 DIAGNOSIS — D509 Iron deficiency anemia, unspecified: Secondary | ICD-10-CM

## 2022-11-09 LAB — IFOBT (OCCULT BLOOD): IFOBT: NEGATIVE

## 2022-11-16 ENCOUNTER — Encounter: Payer: Self-pay | Admitting: Gastroenterology

## 2022-12-31 ENCOUNTER — Ambulatory Visit (INDEPENDENT_AMBULATORY_CARE_PROVIDER_SITE_OTHER): Payer: Medicare PPO

## 2022-12-31 VITALS — BP 138/75 | Ht 60.0 in | Wt 128.0 lb

## 2022-12-31 DIAGNOSIS — Z0000 Encounter for general adult medical examination without abnormal findings: Secondary | ICD-10-CM

## 2022-12-31 NOTE — Progress Notes (Signed)
 Because this visit was a virtual/telehealth visit,  certain criteria was not obtained, such a blood pressure, CBG if patient is a diabetic, and timed up and go. Any medications not marked as "taking" was not mentioned during the medication reconciliation part of the visit. Any vitals not documented were not able to be obtained due to this being a telehealth visit. Vitals documented are verbally provided by the patient.   Subjective:   Destiny Norman is a 77 y.o. female who presents for an Initial Medicare Annual Wellness Visit.  Visit Complete: Virtual  I connected with  Devra Kopinski [place] on 12/31/22 by a audio enabled telemedicine application and verified that I am speaking with the correct person using two identifiers.  Patient Location: Home  Provider Location: Home Office  I discussed the limitations of evaluation and management by telemedicine. The patient expressed understanding and agreed to proceed.  Patient Medicare AWV questionnaire was completed by the patient on n/a; I have confirmed that all information answered by patient is correct and no changes since this date.  Review of Systems     Cardiac Risk Factors include: advanced age (>63men, >33 women);dyslipidemia;hypertension     Objective:    Today's Vitals   12/31/22 1130  BP: 138/75  Weight: 128 lb (58.1 kg)  Height: 5' (1.524 m)   Body mass index is 25 kg/m.     12/31/2022   11:29 AM 10/30/2020   11:06 AM 02/23/2017    2:36 PM 02/14/2015   10:02 AM 09/05/2014    1:17 PM 07/04/2014    7:46 AM  Advanced Directives  Does Patient Have a Medical Advance Directive? Yes Yes Yes Yes Yes Yes  Type of Estate agent of [place];Living will Healthcare Power of [place];Living will Healthcare Power of [place];Living will Healthcare Power of [place];Living will Living will Living will  Does patient want to make changes to medical advance directive? No - Patient declined No - Patient declined  No - Patient declined  No - Patient declined   Copy of Healthcare Power of Attorney in Chart? Yes - validated most recent copy scanned in chart (See row information) No - copy requested No - copy requested No - copy requested No - copy requested No - copy requested    Current Medications (verified) Outpatient Encounter Medications as of 12/31/2022  Medication Sig   Cholecalciferol (VITAMIN D3) 1000 UNITS CAPS Take 1,000 Units by mouth daily.   cyanocobalamin (VITAMIN B12) 1000 MCG tablet Take 1 tablet (1,000 mcg total) by mouth daily.   gabapentin (NEURONTIN) 100 MG capsule Take 100-200 mg in the AM   gabapentin (NEURONTIN) 300 MG capsule Take 1 capsule (300 mg total) by mouth at bedtime.   Iron, Ferrous Sulfate, 325 (65 Fe) MG TABS Take 325 mg by mouth every other day.   lisinopril-hydrochlorothiazide (ZESTORETIC) 10-12.5 MG tablet Take 1 tablet by mouth daily.   metoprolol succinate (TOPROL XL) 25 MG 24 hr tablet Take 25 mg by mouth daily.   omeprazole (PRILOSEC) 20 MG capsule Take 1 capsule (20 mg total) by mouth daily before breakfast.   rosuvastatin (CRESTOR) 10 MG tablet Take 10 mg by mouth daily.   nortriptyline (PAMELOR) 10 MG capsule Take 1 capsule (10 mg total) by mouth at bedtime. (Patient not taking: Reported on 12/31/2022)   No facility-administered encounter medications on file as of 12/31/2022.    Allergies (verified) Penicillins   History: Past Medical History:  Diagnosis Date   Breast cancer (HCC)  High cholesterol    Hypertension    Vitamin D deficiency    Past Surgical History:  Procedure Laterality Date   CESAREAN SECTION     COLONOSCOPY N/A 07/04/2014   Surgeon: Malissa Hippo, MD; incomplete to distal transverse colon secondary to very tortuous sigmoid colon and splenic flexure.  She completed virtual colonoscopy 08/06/2014 which did not show any polyps.   ESOPHAGEAL DILATION N/A 11/05/2020   Procedure: ESOPHAGEAL DILATION;  Surgeon: Malissa Hippo, MD;   [facility];  Service: Endoscopy;  Laterality: N/A;   ESOPHAGOGASTRODUODENOSCOPY (EGD) WITH PROPOFOL N/A 11/05/2020   Surgeon: Malissa Hippo, MD; 2 cm hiatal hernia, otherwise normal exam.  Empiric esophageal dilation was performed.   MASTECTOMY Right 05/24/2002   RADIOLOGY WITH ANESTHESIA N/A 09/10/2014   Procedure: MRI ABDOMIN WITH AND WITHOUT;  Surgeon: Medication Radiologist, MD;  Location: [facility];  Service: Radiology;  Laterality: N/A;   RECONSTRUCTION BREAST W/ TRAM FLAP     Family History  Problem Relation Age of Onset   Hypertension Mother    Lung cancer Mother    Prostate cancer Father    Skin cancer Sister    Melanoma Sister    Cancer Brother    Prostate cancer Brother    Healthy Son    Sleep apnea Neg Hx    Social History   Socioeconomic History   Marital status: Married    Spouse name: Not on file   Number of children: Not on file   Years of education: Not on file   Highest education level: Not on file  Occupational History   Not on file  Tobacco Use   Smoking status: Never   Smokeless tobacco: Never  Substance and Sexual Activity   Alcohol use: Yes   Drug use: No   Sexual activity: Yes  Other Topics Concern   Not on file  Social History Narrative   Not on file   Social Determinants of Health   Financial Resource Strain: Low Risk  (12/31/2022)   Overall Financial Resource Strain (CARDIA)    Difficulty of Paying Living Expenses: Not hard at all  Food Insecurity: No Food Insecurity (12/31/2022)   Hunger Vital Sign    Worried About Running Out of Food in the Last Year: Never true    Ran Out of Food in the Last Year: Never true  Transportation Needs: No Transportation Needs (12/31/2022)   PRAPARE - Administrator, Civil Service (Medical): No    Lack of Transportation (Non-Medical): No  Physical Activity: Sufficiently Active (12/31/2022)   Exercise Vital Sign    Days of Exercise per Week: 7 days    Minutes of Exercise per Session: 30  min  Stress: No Stress Concern Present (12/31/2022)   [employer] of Occupational Health - Occupational Stress Questionnaire    Feeling of Stress : Not at all  Social Connections: Moderately Isolated (12/31/2022)   Social Connection and Isolation Panel [NHANES]    Frequency of Communication with Friends and Family: More than three times a week    Frequency of Social Gatherings with Friends and Family: More than three times a week    Attends Religious Services: Never    Database administrator or Organizations: No    Attends Banker Meetings: Never    Marital Status: Married    Tobacco Counseling Counseling given: Yes   Clinical Intake:  Pre-visit preparation completed: Yes  Pain : No/denies pain     BMI -  recorded: 25 Nutritional Status: BMI 25 -29 Overweight Nutritional Risks: None Diabetes: No  How often do you need to have someone help you when you read instructions, pamphlets, or other written materials from your doctor or pharmacy?: 1 - Never  Interpreter Needed?: No  Information entered by ::  Jesenia Spera, CMA   Activities of Daily Living    12/31/2022   11:39 AM  In your present state of health, do you have any difficulty performing the following activities:  Hearing? 0  Vision? 0  Difficulty concentrating or making decisions? 0  Walking or climbing stairs? 0  Dressing or bathing? 0  Doing errands, shopping? 0  Preparing Food and eating ? N  Using the Toilet? N  In the past six months, have you accidently leaked urine? N  Do you have problems with loss of bowel control? N  Managing your Medications? N  Managing your Finances? N  Housekeeping or managing your Housekeeping? N    Patient Care Team: Tommie Sams, DO as PCP - General (Family Medicine) Etter Sjogren, MD as Consulting Physician (Plastic Surgery) Ruthy Dick, MD (Unknown Physician Specialty) Lorenda Ishihara, MD as Referring Physician (Internal Medicine) Marguerita Merles, Reuel Boom, MD as Consulting Physician (Gastroenterology)  Indicate any recent Medical Services you may have received from other than [facility] providers in the past year (date may be approximate).     Assessment:   This is a routine wellness examination for Destiny Norman.  Hearing/Vision screen Hearing Screening - Comments:: Patient denies any hearing difficulties.    Dietary issues and exercise activities discussed:     Goals Addressed             This Visit's Progress    Patient Stated       "Increase activity, eat healthier, and travel more"       Depression Screen    12/31/2022   11:35 AM 10/14/2022    9:40 AM  PHQ 2/9 Scores  PHQ - 2 Score 0 2  PHQ- 9 Score  8    Fall Risk    12/31/2022   11:38 AM 10/14/2022    9:41 AM  Fall Risk   Falls in the past year? 0 0  Number falls in past yr: 0 0  Injury with Fall? 0 0  Risk for fall due to : No Fall Risks   Follow up Falls prevention discussed     MEDICARE RISK AT HOME:  Medicare Risk at Home - 12/31/22 1137     Any stairs in or around the home? Yes    If so, are there any without handrails? No    Home free of loose throw rugs in walkways, pet beds, electrical cords, etc? Yes    Adequate lighting in your home to reduce risk of falls? Yes    Life alert? No    Use of a cane, walker or w/c? No    Grab bars in the bathroom? Yes    Shower chair or bench in shower? Yes    Elevated toilet seat or a handicapped toilet? No             TIMED UP AND GO:  Was the test performed? No    Cognitive Function:        12/31/2022   11:33 AM  6CIT Screen  What Year? 0 points  What month? 0 points  What time? 0 points  Count back from 20 0 points  Months in reverse 0 points  Repeat phrase  0 points  Total Score 0 points    Immunizations  There is no immunization history on file for this patient.  TDAP status: Due, Education has been provided regarding the importance of this vaccine. Advised may receive this  vaccine at local pharmacy or Health Dept. Aware to provide a copy of the vaccination record if obtained from local pharmacy or Health Dept. Verbalized acceptance and understanding.  Flu Vaccine status: Due, Education has been provided regarding the importance of this vaccine. Advised may receive this vaccine at local pharmacy or Health Dept. Aware to provide a copy of the vaccination record if obtained from local pharmacy or Health Dept. Verbalized acceptance and understanding.  Pneumococcal vaccine status: Due, Education has been provided regarding the importance of this vaccine. Advised may receive this vaccine at local pharmacy or Health Dept. Aware to provide a copy of the vaccination record if obtained from local pharmacy or Health Dept. Verbalized acceptance and understanding.  Covid-19 vaccine status: Information provided on how to obtain vaccines.   Qualifies for Shingles Vaccine? Yes   Zostavax completed No   Shingrix Completed?: No.    Education has been provided regarding the importance of this vaccine. Patient has been advised to call insurance company to determine out of pocket expense if they have not yet received this vaccine. Advised may also receive vaccine at local pharmacy or Health Dept. Verbalized acceptance and understanding.  Screening Tests Health Maintenance  Topic Date Due   Medicare Annual Wellness (AWV)  Never done   Hepatitis C Screening  Never done   DTaP/Tdap/Td (1 - Tdap) Never done   Zoster Vaccines- Shingrix (1 of 2) Never done   Pneumonia Vaccine 50+ Years old (1 of 1 - PCV) Never done   COVID-19 Vaccine (1 - 2023-24 season) Never done   INFLUENZA VACCINE  12/23/2022   Colonoscopy  07/04/2024   DEXA SCAN  Completed   HPV VACCINES  Aged Out    Health Maintenance  Health Maintenance Due  Topic Date Due   Medicare Annual Wellness (AWV)  Never done   Hepatitis C Screening  Never done   DTaP/Tdap/Td (1 - Tdap) Never done   Zoster Vaccines- Shingrix (1  of 2) Never done   Pneumonia Vaccine 9+ Years old (1 of 1 - PCV) Never done   COVID-19 Vaccine (1 - 2023-24 season) Never done   INFLUENZA VACCINE  12/23/2022    Colorectal cancer screening: Type of screening: Colonoscopy. Completed 07/04/2014. Repeat every 10 years  Mammogram status: No longer required due to patient states she had breast cancer and had surgery and doesn't have breast tissues so she no longer gets mammograms.  Bone Density status: Completed 11/09/2022. Results reflect: Bone density results: OSTEOPENIA. Repeat every 2 years.  Lung Cancer Screening: (Low Dose CT Chest recommended if Age 49-80 years, 20 pack-year currently smoking OR have quit w/in 15years.) does not qualify.    Additional Screening:  Hepatitis C Screening: does qualify; patient declined  Vision Screening: Recommended annual ophthalmology exams for early detection of glaucoma and other disorders of the eye. Is the patient up to date with their annual eye exam?  Yes  Who is the provider or what is the name of the office in which the patient attends annual eye exams? Dr. Nyra Capes [place] If pt is not established with a provider, would they like to be referred to a provider to establish care? No .   Dental Screening: Recommended annual dental exams for proper oral hygiene  Diabetic Foot Exam: na  Community Resource Referral / Chronic Care Management: CRR required this visit?  No   CCM required this visit?  No     Plan:     I have personally reviewed and noted the following in the patient's chart:   Medical and social history Use of alcohol, tobacco or illicit drugs  Current medications and supplements including opioid prescriptions. Patient is not currently taking opioid prescriptions. Functional ability and status Nutritional status Physical activity Advanced directives List of other physicians Hospitalizations, surgeries, and ER visits in previous 12 months Vitals Screenings to include  cognitive, depression, and falls Referrals and appointments  In addition, I have reviewed and discussed with patient certain preventive protocols, quality metrics, and best practice recommendations. A written personalized care plan for preventive services as well as general preventive health recommendations were provided to patient.     Jordan Hawks Verdell Kincannon, CMA   12/31/2022   After Visit Summary: (MyChart) Due to this being a telephonic visit, the after visit summary with patients personalized plan was offered to patient via MyChart   Nurse Notes:

## 2022-12-31 NOTE — Patient Instructions (Signed)
Destiny Norman , Thank you for taking time to come for your Medicare Wellness Visit. I appreciate your ongoing commitment to your health goals. Please review the following plan we discussed and let me know if I can assist you in the future.   These are the goals we discussed:  Goals      Patient Stated     "Increase activity, eat healthier, and travel more"        This is a list of the screening recommended for you and due dates:  Health Maintenance  Topic Date Due   Hepatitis C Screening  Never done   DTaP/Tdap/Td vaccine (1 - Tdap) Never done   Zoster (Shingles) Vaccine (1 of 2) Never done   Pneumonia Vaccine (1 of 1 - PCV) Never done   COVID-19 Vaccine (1 - 2023-24 season) Never done   Flu Shot  12/23/2022   Medicare Annual Wellness Visit  12/31/2023   Colon Cancer Screening  07/04/2024   DEXA scan (bone density measurement)  Completed   HPV Vaccine  Aged Out    Advanced directives: Information on Advanced Care Planning can be found at [facility] of [facility] Advance Health Care Directives Advance Health Care Directives ([email or web address]    Conditions/risks identified:  You are due for the vaccines checked below. You may have these done at your preferred pharmacy. Please have them fax the office proof of the vaccines so that we can update your chart.   [x]  Flu (due annually) [x]  Shingrix (Shingles vaccine) [x]  Pneumonia Vaccines [x]  TDAP (Tetanus) Vaccine every 10 years []  Covid-19   Next appointment: VIRTUAL/TELEPHONE APPOINTMENT Follow up in one year for your annual wellness visit  January 06, 2024 at 10:00 am telephone visit   Preventive Care 65 Years and Older, Female Preventive care refers to lifestyle choices and visits with your health care provider that can promote health and wellness. What does preventive care include? A yearly physical exam. This is also called an annual well check. Dental exams once or twice a year. Routine eye exams. Ask your health care  provider how often you should have your eyes checked. Personal lifestyle choices, including: Daily care of your teeth and gums. Regular physical activity. Eating a healthy diet. Avoiding tobacco and drug use. Limiting alcohol use. Practicing safe sex. Taking low-dose aspirin every day. Taking vitamin and mineral supplements as recommended by your health care provider. What happens during an annual well check? The services and screenings done by your health care provider during your annual well check will depend on your age, overall health, lifestyle risk factors, and family history of disease. Counseling  Your health care provider may ask you questions about your: Alcohol use. Tobacco use. Drug use. Emotional well-being. Home and relationship well-being. Sexual activity. Eating habits. History of falls. Memory and ability to understand (cognition). Work and work Astronomer. Reproductive health. Screening  You may have the following tests or measurements: Height, weight, and BMI. Blood pressure. Lipid and cholesterol levels. These may be checked every 5 years, or more frequently if you are over 57 years old. Skin check. Lung cancer screening. You may have this screening every year starting at age 30 if you have a 30-pack-year history of smoking and currently smoke or have quit within the past 15 years. Fecal occult blood test (FOBT) of the stool. You may have this test every year starting at age 66. Flexible sigmoidoscopy or colonoscopy. You may have a sigmoidoscopy every 5 years or a colonoscopy every  10 years starting at age 30. Hepatitis C blood test. Hepatitis B blood test. Sexually transmitted disease (STD) testing. Diabetes screening. This is done by checking your blood sugar (glucose) after you have not eaten for a while (fasting). You may have this done every 1-3 years. Bone density scan. This is done to screen for osteoporosis. You may have this done starting at age  66. Mammogram. This may be done every 1-2 years. Talk to your health care provider about how often you should have regular mammograms. Talk with your health care provider about your test results, treatment options, and if necessary, the need for more tests. Vaccines  Your health care provider may recommend certain vaccines, such as: Influenza vaccine. This is recommended every year. Tetanus, diphtheria, and acellular pertussis (Tdap, Td) vaccine. You may need a Td booster every 10 years. Zoster vaccine. You may need this after age 80. Pneumococcal 13-valent conjugate (PCV13) vaccine. One dose is recommended after age 18. Pneumococcal polysaccharide (PPSV23) vaccine. One dose is recommended after age 61. Talk to your health care provider about which screenings and vaccines you need and how often you need them. This information is not intended to replace advice given to you by your health care provider. Make sure you discuss any questions you have with your health care provider. Document Released: 06/06/2015 Document Revised: 01/28/2016 Document Reviewed: 03/11/2015 Elsevier Interactive Patient Education  2017 [employer].  Fall Prevention in the Home Falls can cause injuries. They can happen to people of all ages. There are many things you can do to make your home safe and to help prevent falls. What can I do on the outside of my home? Regularly fix the edges of walkways and driveways and fix any cracks. Remove anything that might make you trip as you walk through a door, such as a raised step or threshold. Trim any bushes or trees on the path to your home. Use bright outdoor lighting. Clear any walking paths of anything that might make someone trip, such as rocks or tools. Regularly check to see if handrails are loose or broken. Make sure that both sides of any steps have handrails. Any raised decks and porches should have guardrails on the edges. Have any leaves, snow, or ice cleared  regularly. Use sand or salt on walking paths during winter. Clean up any spills in your garage right away. This includes oil or grease spills. What can I do in the bathroom? Use night lights. Install grab bars by the toilet and in the tub and shower. Do not use towel bars as grab bars. Use non-skid mats or decals in the tub or shower. If you need to sit down in the shower, use a plastic, non-slip stool. Keep the floor dry. Clean up any water that spills on the floor as soon as it happens. Remove soap buildup in the tub or shower regularly. Attach bath mats securely with double-sided non-slip rug tape. Do not have throw rugs and other things on the floor that can make you trip. What can I do in the bedroom? Use night lights. Make sure that you have a light by your bed that is easy to reach. Do not use any sheets or blankets that are too big for your bed. They should not hang down onto the floor. Have a firm chair that has side arms. You can use this for support while you get dressed. Do not have throw rugs and other things on the floor that can make you  trip. What can I do in the kitchen? Clean up any spills right away. Avoid walking on wet floors. Keep items that you use a lot in easy-to-reach places. If you need to reach something above you, use a strong step stool that has a grab bar. Keep electrical cords out of the way. Do not use floor polish or wax that makes floors slippery. If you must use wax, use non-skid floor wax. Do not have throw rugs and other things on the floor that can make you trip. What can I do with my stairs? Do not leave any items on the stairs. Make sure that there are handrails on both sides of the stairs and use them. Fix handrails that are broken or loose. Make sure that handrails are as long as the stairways. Check any carpeting to make sure that it is firmly attached to the stairs. Fix any carpet that is loose or worn. Avoid having throw rugs at the top or  bottom of the stairs. If you do have throw rugs, attach them to the floor with carpet tape. Make sure that you have a light switch at the top of the stairs and the bottom of the stairs. If you do not have them, ask someone to add them for you. What else can I do to help prevent falls? Wear shoes that: Do not have high heels. Have rubber bottoms. Are comfortable and fit you well. Are closed at the toe. Do not wear sandals. If you use a stepladder: Make sure that it is fully opened. Do not climb a closed stepladder. Make sure that both sides of the stepladder are locked into place. Ask someone to hold it for you, if possible. Clearly mark and make sure that you can see: Any grab bars or handrails. First and last steps. Where the edge of each step is. Use tools that help you move around (mobility aids) if they are needed. These include: Canes. Walkers. Scooters. Crutches. Turn on the lights when you go into a dark area. Replace any light bulbs as soon as they burn out. Set up your furniture so you have a clear path. Avoid moving your furniture around. If any of your floors are uneven, fix them. If there are any pets around you, be aware of where they are. Review your medicines with your doctor. Some medicines can make you feel dizzy. This can increase your chance of falling. Ask your doctor what other things that you can do to help prevent falls. This information is not intended to replace advice given to you by your health care provider. Make sure you discuss any questions you have with your health care provider. Document Released: 03/06/2009 Document Revised: 10/16/2015 Document Reviewed: 06/14/2014 Elsevier Interactive Patient Education  2017 [employer].

## 2023-01-10 ENCOUNTER — Telehealth: Payer: Medicare PPO | Admitting: Adult Health

## 2023-01-10 DIAGNOSIS — G4733 Obstructive sleep apnea (adult) (pediatric): Secondary | ICD-10-CM | POA: Diagnosis not present

## 2023-02-04 NOTE — Progress Notes (Unsigned)
Referring Provider: Tommie Sams, DO Primary Care Physician:  Tommie Sams, DO Primary GI Physician: Dr. Levon Hedger   No chief complaint on file.   HPI:   Destiny Norman is a 77 y.o. female presenting today for follow-up of iron deficiency anemia.  Last seen in the office 11/08/2022 at the request of Dr. Adriana Simas for normocytic anemia.  Patient had been found to be mildly anemic with hemoglobin 11.0 in May 2024.  Ferritin was 13, iron saturation 10%, B12 low normal at 255.  She had been started on oral iron and B12 supplementation at that time.  At the time of her office visit, she noted dark stools since taking oral iron. No overt GI bleeding.  She was having constipation with iron every other day.  Rare heartburn.  No other significant GI symptoms.  No unintentional weight loss.  She was taking ibuprofen several days a week due to persistent headache.  Also reported she did not eat meat and felt she was not consuming enough iron in her diet regularly.  We discussed scheduling colonoscopy and EGD, but patient requested to hold off on this as she felt her diet was the primary cause of her iron deficiency anemia and requested to work on this over the next few months.  I recommended completing iFOBT to evaluate for occult blood loss and also starting daily omeprazole for GI prophylaxis in the setting of chronic NSAID use.  Also recommended Colace daily for constipation.  May try MiraLAX if Colace not helpful.  iFOBT was negative.  Today:      Colonoscopy last attempted February 2016 by Dr. Karilyn Cota which was incomplete to distal transverse colon secondary to very tortuous sigmoid colon and splenic flexure.  She completed virtual colonoscopy 08/06/2014 which did not show any polyps.   Last EGD 11/05/2020 with 2 cm hiatal hernia, otherwise normal exam. Empiric esophageal dilation was performed.   Past Medical History:  Diagnosis Date   Breast cancer (HCC)    High cholesterol     Hypertension    Vitamin D deficiency     Past Surgical History:  Procedure Laterality Date   CESAREAN SECTION     COLONOSCOPY N/A 07/04/2014   Surgeon: Malissa Hippo, MD; incomplete to distal transverse colon secondary to very tortuous sigmoid colon and splenic flexure.  She completed virtual colonoscopy 08/06/2014 which did not show any polyps.   ESOPHAGEAL DILATION N/A 11/05/2020   Procedure: ESOPHAGEAL DILATION;  Surgeon: Malissa Hippo, MD;  Location: [facility];  Service: Endoscopy;  Laterality: N/A;   ESOPHAGOGASTRODUODENOSCOPY (EGD) WITH PROPOFOL N/A 11/05/2020   Surgeon: Malissa Hippo, MD; 2 cm hiatal hernia, otherwise normal exam.  Empiric esophageal dilation was performed.   MASTECTOMY Right 05/24/2002   RADIOLOGY WITH ANESTHESIA N/A 09/10/2014   Procedure: MRI ABDOMIN WITH AND WITHOUT;  Surgeon: Medication Radiologist, MD;  Location: [facility] OR;  Service: Radiology;  Laterality: N/A;   RECONSTRUCTION BREAST W/ TRAM FLAP      Current Outpatient Medications  Medication Sig Dispense Refill   Cholecalciferol (VITAMIN D3) 1000 UNITS CAPS Take 1,000 Units by mouth daily.     cyanocobalamin (VITAMIN B12) 1000 MCG tablet Take 1 tablet (1,000 mcg total) by mouth daily. 90 tablet 1   gabapentin (NEURONTIN) 100 MG capsule Take 100-200 mg in the AM 60 capsule 11   gabapentin (NEURONTIN) 300 MG capsule Take 1 capsule (300 mg total) by mouth at bedtime. 90 capsule 3   Iron, Ferrous  Sulfate, 325 (65 Fe) MG TABS Take 325 mg by mouth every other day. 45 tablet 1   lisinopril-hydrochlorothiazide (ZESTORETIC) 10-12.5 MG tablet Take 1 tablet by mouth daily.     metoprolol succinate (TOPROL XL) 25 MG 24 hr tablet Take 25 mg by mouth daily.     nortriptyline (PAMELOR) 10 MG capsule Take 1 capsule (10 mg total) by mouth at bedtime. (Patient not taking: Reported on 12/31/2022) 30 capsule 3   omeprazole (PRILOSEC) 20 MG capsule Take 1 capsule (20 mg total) by mouth daily before breakfast. 90 capsule  1   rosuvastatin (CRESTOR) 10 MG tablet Take 10 mg by mouth daily.     No current facility-administered medications for this visit.    Allergies as of 02/07/2023 - Review Complete 12/31/2022  Allergen Reaction Noted   Penicillins Rash 01/04/2014    Family History  Problem Relation Age of Onset   Hypertension Mother    Lung cancer Mother    Prostate cancer Father    Skin cancer Sister    Melanoma Sister    Cancer Brother    Prostate cancer Brother    Healthy Son    Sleep apnea Neg Hx     Social History   Socioeconomic History   Marital status: Married    Spouse name: Not on file   Number of children: Not on file   Years of education: Not on file   Highest education level: Not on file  Occupational History   Not on file  Tobacco Use   Smoking status: Never   Smokeless tobacco: Never  Substance and Sexual Activity   Alcohol use: Yes   Drug use: No   Sexual activity: Yes  Other Topics Concern   Not on file  Social History Narrative   Not on file   Social Determinants of Health   Financial Resource Strain: Low Risk  (12/31/2022)   Overall Financial Resource Strain (CARDIA)    Difficulty of Paying Living Expenses: Not hard at all  Food Insecurity: No Food Insecurity (12/31/2022)   Hunger Vital Sign    Worried About Running Out of Food in the Last Year: Never true    Ran Out of Food in the Last Year: Never true  Transportation Needs: No Transportation Needs (12/31/2022)   PRAPARE - Administrator, Civil Service (Medical): No    Lack of Transportation (Non-Medical): No  Physical Activity: Sufficiently Active (12/31/2022)   Exercise Vital Sign    Days of Exercise per Week: 7 days    Minutes of Exercise per Session: 30 min  Stress: No Stress Concern Present (12/31/2022)   [employer] of Occupational Health - Occupational Stress Questionnaire    Feeling of Stress : Not at all  Social Connections: Moderately Isolated (12/31/2022)   Social Connection and  Isolation Panel [NHANES]    Frequency of Communication with Friends and Family: More than three times a week    Frequency of Social Gatherings with Friends and Family: More than three times a week    Attends Religious Services: Never    Database administrator or Organizations: No    Attends Banker Meetings: Never    Marital Status: Married    Review of Systems: Gen: Denies fever, chills, anorexia. Denies fatigue, weakness, weight loss.  CV: Denies chest pain, palpitations, syncope, peripheral edema, and claudication. Resp: Denies dyspnea at rest, cough, wheezing, coughing up blood, and pleurisy. GI: Denies vomiting blood, jaundice, and fecal incontinence.  Denies dysphagia or odynophagia. Derm: Denies rash, itching, dry skin Psych: Denies depression, anxiety, memory loss, confusion. No homicidal or suicidal ideation.  Heme: Denies bruising, bleeding, and enlarged lymph nodes.  Physical Exam: There were no vitals taken for this visit. General:   Alert and oriented. No distress noted. Pleasant and cooperative.  Head:  Normocephalic and atraumatic. Eyes:  Conjuctiva clear without scleral icterus. Heart:  S1, S2 present without murmurs appreciated. Lungs:  Clear to auscultation bilaterally. No wheezes, rales, or rhonchi. No distress.  Abdomen:  +BS, soft, non-tender and non-distended. No rebound or guarding. No HSM or masses noted. Msk:  Symmetrical without gross deformities. Normal posture. Extremities:  Without edema. Neurologic:  Alert and  oriented x4 Psych:  Normal mood and affect.    Assessment:     Plan:  ***   Ermalinda Memos, PA-C [facility] 02/07/2023

## 2023-02-07 ENCOUNTER — Telehealth: Payer: Self-pay | Admitting: Gastroenterology

## 2023-02-07 ENCOUNTER — Ambulatory Visit: Payer: Medicare PPO | Admitting: Gastroenterology

## 2023-02-07 ENCOUNTER — Encounter: Payer: Self-pay | Admitting: Gastroenterology

## 2023-02-07 VITALS — BP 124/62 | HR 53 | Temp 97.8°F | Ht 60.0 in | Wt 126.8 lb

## 2023-02-07 DIAGNOSIS — R6881 Early satiety: Secondary | ICD-10-CM

## 2023-02-07 DIAGNOSIS — R112 Nausea with vomiting, unspecified: Secondary | ICD-10-CM | POA: Diagnosis not present

## 2023-02-07 DIAGNOSIS — D509 Iron deficiency anemia, unspecified: Secondary | ICD-10-CM | POA: Diagnosis not present

## 2023-02-07 DIAGNOSIS — K219 Gastro-esophageal reflux disease without esophagitis: Secondary | ICD-10-CM | POA: Diagnosis not present

## 2023-02-07 DIAGNOSIS — K5900 Constipation, unspecified: Secondary | ICD-10-CM | POA: Diagnosis not present

## 2023-02-07 MED ORDER — OMEPRAZOLE 20 MG PO CPDR
20.0000 mg | DELAYED_RELEASE_CAPSULE | Freq: Two times a day (BID) | ORAL | 3 refills | Status: DC
Start: 2023-02-07 — End: 2023-10-31

## 2023-02-07 NOTE — Patient Instructions (Addendum)
I will discuss your case with Dr. Levon Hedger regarding the possibility of a colonoscopy.  We will reach out to you with further recommendations.  Please have blood work completed at [facility].  Increase omeprazole to 20 mg twice daily 30 minutes before breakfast and 30 minutes before dinner.  Follow a GERD diet:  Avoid fried, fatty, greasy, spicy, citrus foods. Avoid caffeine and carbonated beverages. Avoid chocolate. Try eating 4-6 small meals a day rather than 3 large meals. Do not eat within 3 hours of laying down. Prop head of bed up on wood or bricks to create a 6 inch incline.   Start MiraLAX 1 capful (17 g) daily in 8 ounces of water or other noncarbonated beverage of your choice for constipation.  Continue taking iron every other day.  Avoid ibuprofen and all other NSAID products is much as possible.   Ermalinda Memos, PA-C [facility]

## 2023-02-07 NOTE — Telephone Encounter (Signed)
Please let patient know that her case was discussed with Dr. Levon Hedger who is agreeable to repeating colonoscopy for her.  We can complete this at the same time as an upper endoscopy.  Recommend proceeding with a EGD and colonoscopy with propofol with Dr. Levon Hedger. Dx: IDA, early satiety, GERD ASA 2 BMP prior Hold iron x 7 days prior to procedures.

## 2023-02-08 LAB — CBC WITH DIFFERENTIAL/PLATELET
Basophils Absolute: 0 10*3/uL (ref 0.0–0.2)
Basos: 1 %
EOS (ABSOLUTE): 0.2 10*3/uL (ref 0.0–0.4)
Eos: 4 %
Hematocrit: 38 % (ref 34.0–46.6)
Hemoglobin: 12.3 g/dL (ref 11.1–15.9)
Immature Grans (Abs): 0 10*3/uL (ref 0.0–0.1)
Immature Granulocytes: 0 %
Lymphocytes Absolute: 1.4 10*3/uL (ref 0.7–3.1)
Lymphs: 27 %
MCH: 28.9 pg (ref 26.6–33.0)
MCHC: 32.4 g/dL (ref 31.5–35.7)
MCV: 89 fL (ref 79–97)
Monocytes Absolute: 0.5 10*3/uL (ref 0.1–0.9)
Monocytes: 11 %
Neutrophils Absolute: 2.9 10*3/uL (ref 1.4–7.0)
Neutrophils: 57 %
Platelets: 288 10*3/uL (ref 150–450)
RBC: 4.25 x10E6/uL (ref 3.77–5.28)
RDW: 14.1 % (ref 11.7–15.4)
WBC: 5.1 10*3/uL (ref 3.4–10.8)

## 2023-02-08 LAB — IRON,TIBC AND FERRITIN PANEL
Ferritin: 37 ng/mL (ref 15–150)
Iron Saturation: 22 % (ref 15–55)
Iron: 82 ug/dL (ref 27–139)
Total Iron Binding Capacity: 377 ug/dL (ref 250–450)
UIBC: 295 ug/dL (ref 118–369)

## 2023-02-09 ENCOUNTER — Ambulatory Visit: Payer: Medicare PPO | Admitting: Gastroenterology

## 2023-02-09 NOTE — Telephone Encounter (Signed)
LMOVM to call back

## 2023-02-10 NOTE — Telephone Encounter (Signed)
Spoke with pt and she stated she is unable to do procedures at this time. She stated she may have to wait a couple of months. She is going to call when she is ready. FYI

## 2023-02-10 NOTE — Telephone Encounter (Signed)
Noted  

## 2023-03-16 ENCOUNTER — Telehealth: Payer: Self-pay | Admitting: Family Medicine

## 2023-03-16 NOTE — Telephone Encounter (Signed)
Refill on    rosuvastatin (CRESTOR) 10 MG tablet  Send to [facility]

## 2023-03-17 ENCOUNTER — Other Ambulatory Visit: Payer: Self-pay | Admitting: Nurse Practitioner

## 2023-03-17 MED ORDER — ROSUVASTATIN CALCIUM 10 MG PO TABS: 10.0000 mg | ORAL_TABLET | Freq: Every day | ORAL | 1 refills | Status: DC

## 2023-04-04 DIAGNOSIS — G4733 Obstructive sleep apnea (adult) (pediatric): Secondary | ICD-10-CM | POA: Diagnosis not present

## 2023-04-05 ENCOUNTER — Telehealth: Payer: Self-pay | Admitting: *Deleted

## 2023-04-05 ENCOUNTER — Ambulatory Visit: Payer: Medicare PPO | Admitting: Gastroenterology

## 2023-04-05 ENCOUNTER — Encounter: Payer: Self-pay | Admitting: *Deleted

## 2023-04-05 ENCOUNTER — Other Ambulatory Visit: Payer: Self-pay | Admitting: *Deleted

## 2023-04-05 ENCOUNTER — Encounter: Payer: Self-pay | Admitting: Gastroenterology

## 2023-04-05 VITALS — BP 134/80 | HR 54 | Temp 97.8°F | Ht 60.0 in | Wt 125.4 lb

## 2023-04-05 DIAGNOSIS — R6881 Early satiety: Secondary | ICD-10-CM

## 2023-04-05 DIAGNOSIS — R197 Diarrhea, unspecified: Secondary | ICD-10-CM | POA: Diagnosis not present

## 2023-04-05 DIAGNOSIS — D509 Iron deficiency anemia, unspecified: Secondary | ICD-10-CM | POA: Diagnosis not present

## 2023-04-05 DIAGNOSIS — K219 Gastro-esophageal reflux disease without esophagitis: Secondary | ICD-10-CM

## 2023-04-05 DIAGNOSIS — K5900 Constipation, unspecified: Secondary | ICD-10-CM

## 2023-04-05 DIAGNOSIS — R112 Nausea with vomiting, unspecified: Secondary | ICD-10-CM | POA: Diagnosis not present

## 2023-04-05 DIAGNOSIS — Z791 Long term (current) use of non-steroidal anti-inflammatories (NSAID): Secondary | ICD-10-CM

## 2023-04-05 MED ORDER — PEG 3350-KCL-NA BICARB-NACL 420 G PO SOLR: 4000.0000 mL | Freq: Once | ORAL | 0 refills | Status: AC

## 2023-04-05 NOTE — H&P (View-Only) (Signed)
 GI Office Note    Referring Provider: Tommie Sams, DO Primary Care Physician:  Tommie Sams, DO Primary Gastroenterologist: Dolores Frame, MD  Date:  04/05/2023  ID:  Ambreia Hogancamp Kobayashi, DOB 1945/08/07, MRN [id number]   Chief Complaint   Chief Complaint  Patient presents with   Abdominal Pain    Nausea and vomiting. Diarrhea one time.    History of Present Illness  Evalyne Lacap is a 77 y.o. female with a history of IDA, HTN, breast cancer, HLD, vitamin D deficiency, GERD, and constipation presenting today with complaint of nausea/vomiting, and 1 occurrence of diarrhea.  iFOBT negative previously  Last office visit 02/07/2023 with Ermalinda Memos. Recommended start MiraLAX and 8 ounces of water daily.  Continue iron every other day and avoid NSAIDs.  Follow GERD diet and increase omeprazole to 20 mg twice daily.  Advised she would discuss potential colonoscopy with Dr. Levon Hedger.  Ordered CBC and iron panel.     Latest Ref Rng & Units 02/07/2023   11:34 AM 10/14/2022   10:24 AM 04/13/2021   11:49 AM  CBC  WBC 3.4 - 10.8 x10E3/uL 5.1  6.8  6.5   Hemoglobin 11.1 - 15.9 g/dL 40.9  81.1  91.4   Hematocrit 34.0 - 46.6 % 38.0  34.0  36.4   Platelets 150 - 450 x10E3/uL 288  321      Iron/TIBC/Ferritin/ %Sat    Component Value Date/Time   IRON 82 02/07/2023 1134   TIBC 377 02/07/2023 1134   FERRITIN 37 02/07/2023 1134   IRONPCTSAT 22 02/07/2023 1134    Dr. Levon Hedger advised that we could proceed with upper endoscopy as well as colonoscopy.    Today:  States her ate a piece of pasty and felt it go down and instantly she did not feel good and then was not able to eat again till last night (this happened of Friday). Before this she was doing fine initially. In the morning she has been having episodes of overall not feeling well. Believe it was an apple fritter and had some cinnamon on it. Had one episode of emesis and felt sick and had one episode of loose  stools.   Apple do not typically bother her and now she thinks it could be the cinnamon. Began to feel better last night and feels better this morning.   Taking iron every other day. No hematochezia or melena.   Does miralax as needed (once per week, sometimes more) this depends on what she eats. Does take the stool softener nightly.   Wt Readings from Last 3 Encounters:  04/05/23 125 lb 6.4 oz (56.9 kg)  02/07/23 126 lb 12.8 oz (57.5 kg)  12/31/22 128 lb (58.1 kg)    Current Outpatient Medications  Medication Sig Dispense Refill   Cholecalciferol (VITAMIN D3) 1000 UNITS CAPS Take 1,000 Units by mouth daily.     cyanocobalamin (VITAMIN B12) 1000 MCG tablet Take 1 tablet (1,000 mcg total) by mouth daily. 90 tablet 1   gabapentin (NEURONTIN) 100 MG capsule Take 100-200 mg in the AM 60 capsule 11   gabapentin (NEURONTIN) 300 MG capsule Take 1 capsule (300 mg total) by mouth at bedtime. 90 capsule 3   Iron, Ferrous Sulfate, 325 (65 Fe) MG TABS Take 325 mg by mouth every other day. 45 tablet 1   lisinopril-hydrochlorothiazide (ZESTORETIC) 10-12.5 MG tablet Take 1 tablet by mouth daily.     metoprolol succinate (TOPROL XL) 25 MG 24 hr tablet Take  25 mg by mouth daily.     nortriptyline (PAMELOR) 10 MG capsule Take 1 capsule (10 mg total) by mouth at bedtime. (Patient not taking: Reported on 12/31/2022) 30 capsule 3   omeprazole (PRILOSEC) 20 MG capsule Take 1 capsule (20 mg total) by mouth 2 (two) times daily before a meal. 60 capsule 3   rosuvastatin (CRESTOR) 10 MG tablet Take 1 tablet (10 mg total) by mouth daily. 90 tablet 1   No current facility-administered medications for this visit.    Past Medical History:  Diagnosis Date   Breast cancer (HCC)    High cholesterol    Hypertension    Vitamin D deficiency     Past Surgical History:  Procedure Laterality Date   CESAREAN SECTION     COLONOSCOPY N/A 07/04/2014   Surgeon: Malissa Hippo, MD; incomplete to distal transverse colon  secondary to very tortuous sigmoid colon and splenic flexure.  She completed virtual colonoscopy 08/06/2014 which did not show any polyps.   ESOPHAGEAL DILATION N/A 11/05/2020   Procedure: ESOPHAGEAL DILATION;  Surgeon: Malissa Hippo, MD;  [facility]: [facility];  Service: Endoscopy;  Laterality: N/A;   ESOPHAGOGASTRODUODENOSCOPY (EGD) WITH PROPOFOL N/A 11/05/2020   Surgeon: Malissa Hippo, MD; 2 cm hiatal hernia, otherwise normal exam.  Empiric esophageal dilation was performed.   MASTECTOMY Right 05/24/2002   RADIOLOGY WITH ANESTHESIA N/A 09/10/2014   Procedure: MRI ABDOMIN WITH AND WITHOUT;  Surgeon: Medication Radiologist, MD;  Location: [facility];  Service: Radiology;  Laterality: N/A;   RECONSTRUCTION BREAST W/ TRAM FLAP      Family History  Problem Relation Age of Onset   Hypertension Mother    Lung cancer Mother    Prostate cancer Father    Skin cancer Sister    Melanoma Sister    Cancer Brother    Prostate cancer Brother    Healthy Son    Sleep apnea Neg Hx     Allergies as of 04/05/2023 - Review Complete 04/05/2023  Allergen Reaction Noted   Penicillins Rash 01/04/2014    Social History   Socioeconomic History   Marital status: Married    Spouse name: Not on file   Number of children: Not on file   Years of education: Not on file   Highest education level: Not on file  Occupational History   Not on file  Tobacco Use   Smoking status: Never   Smokeless tobacco: Never  Substance and Sexual Activity   Alcohol use: Yes   Drug use: No   Sexual activity: Yes  Other Topics Concern   Not on file  Social History Narrative   Not on file   Social Determinants of Health   Financial Resource Strain: Low Risk  (12/31/2022)   Overall Financial Resource Strain (CARDIA)    Difficulty of Paying Living Expenses: Not hard at all  Food Insecurity: No Food Insecurity (12/31/2022)   Hunger Vital Sign    Worried About Running Out of Food in the Last Year: Never true    Ran  Out of Food in the Last Year: Never true  Transportation Needs: No Transportation Needs (12/31/2022)   PRAPARE - Administrator, Civil Service (Medical): No    Lack of Transportation (Non-Medical): No  Physical Activity: Sufficiently Active (12/31/2022)   Exercise Vital Sign    Days of Exercise per Week: 7 days    Minutes of Exercise per Session: 30 min  Stress: No Stress Concern Present (12/31/2022)  [employer] of Occupational Health - Occupational Stress Questionnaire    Feeling of Stress : Not at all  Social Connections: Moderately Isolated (12/31/2022)   Social Connection and Isolation Panel [NHANES]    Frequency of Communication with Friends and Family: More than three times a week    Frequency of Social Gatherings with Friends and Family: More than three times a week    Attends Religious Services: Never    Database administrator or Organizations: No    Attends Banker Meetings: Never    Marital Status: Married     Review of Systems   Gen: Denies fever, chills, anorexia. Denies fatigue, weakness, weight loss.  CV: Denies chest pain, palpitations, syncope, peripheral edema, and claudication. Resp: Denies dyspnea at rest, cough, wheezing, coughing up blood, and pleurisy. GI: See HPI Derm: Denies rash, itching, dry skin Psych: Denies depression, anxiety, memory loss, confusion. No homicidal or suicidal ideation.  Heme: Denies bruising, bleeding, and enlarged lymph nodes.  Physical Exam   BP 134/80 (BP Location: Right Arm, Patient Position: Sitting, Cuff Size: Normal)   Pulse (!) 54   Temp 97.8 F (36.6 C) (Temporal)   Ht 5' (1.524 m)   Wt 125 lb 6.4 oz (56.9 kg)   BMI 24.49 kg/m   General:   Alert and oriented. No distress noted. Pleasant and cooperative.  Head:  Normocephalic and atraumatic. Eyes:  Conjuctiva clear without scleral icterus. Mouth:  Oral mucosa pink and moist. Good dentition. No lesions. Lungs:  Clear to auscultation  bilaterally. No wheezes, rales, or rhonchi. No distress.  Heart:  S1, S2 present without murmurs appreciated.  Abdomen:  +BS, soft, non-tender and non-distended. No rebound or guarding. No HSM or masses noted. Rectal: deferred Msk:  Symmetrical without gross deformities. Normal posture. Extremities:  Without edema. Neurologic:  Alert and  oriented x4 Psych:  Alert and cooperative. Normal mood and affect.  Assessment  Geri Ursin is a 77 y.o. female with a history of IDA, HTN, breast cancer, HLD, vitamin D deficiency, GERD, and constipation presenting today with complaint of nausea/vomiting, and 1 occurrence of diarrhea.  IDA: Mildly anemic previously with hemoglobin of 11 with component of iron deficiency and B12 deficiency in May 2024.  Currently on iron every other day and B12 supplementation.  Denies any GI bleeding.  Has had previous chronic use of NSAIDs.  Again today discussed EGD and colonoscopy to further evaluate her IDA and with further discussion she has agreed to proceed given her recent acute illness as well.  She continues to suspect that her IDA is secondary to her vegetarian lifestyle.  Reassuringly her iFOBT was negative in June.  Recent labs with hemoglobin stable at 12.3 while on iron therapy and normal iron panel.  We discussed the difficulties with her last colonoscopy and discussed that we could potentially encounter this again but given her IDA it is recommended that we proceed with upper endoscopy and colonoscopy.  She has agreed.  We discussed potential complications today in detail.  Early Satiety, GERD: No complaints of any significant early satiety since her last visit however over the last several days she has been experiencing frequent nausea with 1 episode of emesis and diarrhea after eating an apple freighter.  Had immediate discomfort to the epigastrium.  After discussion it appears she may have a potential sensitivity to cinnamon as a trigger.  Given the  symptoms as well as her anemia we again discussed upper endoscopy she has agreed to  proceed.  Differentials include gastritis, duodenitis, H. pylori, gastric outlet obstruction, PUD, or potential malignancy.  Constipation: Fairly well-controlled with Colace nightly and MiraLAX as needed.  Likely exacerbated by iron every other day.  N/V: Likely exacerbated by reflux episode, potential trigger of cinnamon.  Feeling improved today, started to feel better last night.  Advised to continue current regimen with PPI twice daily.  PLAN   Proceed with upper endoscopy and colonoscopy with propofol by Dr. Levon Hedger in near future: the risks, benefits, and alternatives have been discussed with the patient in detail. The patient states understanding and desires to proceed. ASA 2 BMP prior Hold iron x 7 days prior to procedures.   Continue omeprazole 20 mg BID GERD diet MiraLAX daily as needed Continue Colace nightly Avoid NSAIDs Follow-up in 3 months   Brooke Bonito, MSN, FNP-BC, AGACNP-BC [facility]  I have reviewed the note and agree with the APP's assessment as described in this progress note  Katrinka Blazing, MD Gastroenterology and Hepatology [facility]

## 2023-04-05 NOTE — Telephone Encounter (Signed)
[employer] PA:  Approved Authorization #[id number]  Tracking #[id number] Dates of service 04/27/2023 - 07/27/2023

## 2023-04-05 NOTE — Patient Instructions (Addendum)
Continue omeprazole 20 mg twice daily.  Continue MiraLAX as needed and Colace nightly.  Continue iron every other day as well as B12 supplement.  We will schedule you for an upper endoscopy and colonoscopy in the near future with Dr. Levon Hedger.  You will receive separate detailed written instructions regarding your prep.  You will need to hold your iron for 1 week prior.  Follow a GERD diet:  Avoid fried, fatty, greasy, spicy, citrus foods. Avoid caffeine and carbonated beverages. Avoid chocolate. Try eating 4-6 small meals a day rather than 3 large meals. Do not eat within 3 hours of laying down. Prop head of bed up on wood or bricks to create a 6 inch incline. Continue to avoid any potential triggers, appears cinnamon may also be a trigger for you.  Follow-up in 3 months.  It was a pleasure to see you today. I want to create trusting relationships with patients. If you receive a survey regarding your visit,  I greatly appreciate you taking time to fill this out on paper or through your MyChart. I value your feedback.  Brooke Bonito, MSN, FNP-BC, AGACNP-BC [facility]

## 2023-04-05 NOTE — Progress Notes (Addendum)
GI Office Note    Referring Provider: Tommie Sams, DO Primary Care Physician:  Tommie Sams, DO Primary Gastroenterologist: Dolores Frame, MD  Date:  04/05/2023  ID:  Destiny Norman, DOB 1945/08/07, MRN [id number]   Chief Complaint   Chief Complaint  Patient presents with   Abdominal Pain    Nausea and vomiting. Diarrhea one time.    History of Present Illness  Destiny Norman is a 77 y.o. female with a history of IDA, HTN, breast cancer, HLD, vitamin D deficiency, GERD, and constipation presenting today with complaint of nausea/vomiting, and 1 occurrence of diarrhea.  iFOBT negative previously  Last office visit 02/07/2023 with Ermalinda Memos. Recommended start MiraLAX and 8 ounces of water daily.  Continue iron every other day and avoid NSAIDs.  Follow GERD diet and increase omeprazole to 20 mg twice daily.  Advised she would discuss potential colonoscopy with Dr. Levon Hedger.  Ordered CBC and iron panel.     Latest Ref Rng & Units 02/07/2023   11:34 AM 10/14/2022   10:24 AM 04/13/2021   11:49 AM  CBC  WBC 3.4 - 10.8 x10E3/uL 5.1  6.8  6.5   Hemoglobin 11.1 - 15.9 g/dL 40.9  81.1  91.4   Hematocrit 34.0 - 46.6 % 38.0  34.0  36.4   Platelets 150 - 450 x10E3/uL 288  321      Iron/TIBC/Ferritin/ %Sat    Component Value Date/Time   IRON 82 02/07/2023 1134   TIBC 377 02/07/2023 1134   FERRITIN 37 02/07/2023 1134   IRONPCTSAT 22 02/07/2023 1134    Dr. Levon Hedger advised that we could proceed with upper endoscopy as well as colonoscopy.    Today:  States her ate a piece of pasty and felt it go down and instantly she did not feel good and then was not able to eat again till last night (this happened of Friday). Before this she was doing fine initially. In the morning she has been having episodes of overall not feeling well. Believe it was an apple fritter and had some cinnamon on it. Had one episode of emesis and felt sick and had one episode of loose  stools.   Apple do not typically bother her and now she thinks it could be the cinnamon. Began to feel better last night and feels better this morning.   Taking iron every other day. No hematochezia or melena.   Does miralax as needed (once per week, sometimes more) this depends on what she eats. Does take the stool softener nightly.   Wt Readings from Last 3 Encounters:  04/05/23 125 lb 6.4 oz (56.9 kg)  02/07/23 126 lb 12.8 oz (57.5 kg)  12/31/22 128 lb (58.1 kg)    Current Outpatient Medications  Medication Sig Dispense Refill   Cholecalciferol (VITAMIN D3) 1000 UNITS CAPS Take 1,000 Units by mouth daily.     cyanocobalamin (VITAMIN B12) 1000 MCG tablet Take 1 tablet (1,000 mcg total) by mouth daily. 90 tablet 1   gabapentin (NEURONTIN) 100 MG capsule Take 100-200 mg in the AM 60 capsule 11   gabapentin (NEURONTIN) 300 MG capsule Take 1 capsule (300 mg total) by mouth at bedtime. 90 capsule 3   Iron, Ferrous Sulfate, 325 (65 Fe) MG TABS Take 325 mg by mouth every other day. 45 tablet 1   lisinopril-hydrochlorothiazide (ZESTORETIC) 10-12.5 MG tablet Take 1 tablet by mouth daily.     metoprolol succinate (TOPROL XL) 25 MG 24 hr tablet Take  25 mg by mouth daily.     nortriptyline (PAMELOR) 10 MG capsule Take 1 capsule (10 mg total) by mouth at bedtime. (Patient not taking: Reported on 12/31/2022) 30 capsule 3   omeprazole (PRILOSEC) 20 MG capsule Take 1 capsule (20 mg total) by mouth 2 (two) times daily before a meal. 60 capsule 3   rosuvastatin (CRESTOR) 10 MG tablet Take 1 tablet (10 mg total) by mouth daily. 90 tablet 1   No current facility-administered medications for this visit.    Past Medical History:  Diagnosis Date   Breast cancer (HCC)    High cholesterol    Hypertension    Vitamin D deficiency     Past Surgical History:  Procedure Laterality Date   CESAREAN SECTION     COLONOSCOPY N/A 07/04/2014   Surgeon: Malissa Hippo, MD; incomplete to distal transverse colon  secondary to very tortuous sigmoid colon and splenic flexure.  She completed virtual colonoscopy 08/06/2014 which did not show any polyps.   ESOPHAGEAL DILATION N/A 11/05/2020   Procedure: ESOPHAGEAL DILATION;  Surgeon: Malissa Hippo, MD;  [facility]: [facility];  Service: Endoscopy;  Laterality: N/A;   ESOPHAGOGASTRODUODENOSCOPY (EGD) WITH PROPOFOL N/A 11/05/2020   Surgeon: Malissa Hippo, MD; 2 cm hiatal hernia, otherwise normal exam.  Empiric esophageal dilation was performed.   MASTECTOMY Right 05/24/2002   RADIOLOGY WITH ANESTHESIA N/A 09/10/2014   Procedure: MRI ABDOMIN WITH AND WITHOUT;  Surgeon: Medication Radiologist, MD;  Location: [facility] OR;  Service: Radiology;  Laterality: N/A;   RECONSTRUCTION BREAST W/ TRAM FLAP      Family History  Problem Relation Age of Onset   Hypertension Mother    Lung cancer Mother    Prostate cancer Father    Skin cancer Sister    Melanoma Sister    Cancer Brother    Prostate cancer Brother    Healthy Son    Sleep apnea Neg Hx     Allergies as of 04/05/2023 - Review Complete 04/05/2023  Allergen Reaction Noted   Penicillins Rash 01/04/2014    Social History   Socioeconomic History   Marital status: Married    Spouse name: Not on file   Number of children: Not on file   Years of education: Not on file   Highest education level: Not on file  Occupational History   Not on file  Tobacco Use   Smoking status: Never   Smokeless tobacco: Never  Substance and Sexual Activity   Alcohol use: Yes   Drug use: No   Sexual activity: Yes  Other Topics Concern   Not on file  Social History Narrative   Not on file   Social Determinants of Health   Financial Resource Strain: Low Risk  (12/31/2022)   Overall Financial Resource Strain (CARDIA)    Difficulty of Paying Living Expenses: Not hard at all  Food Insecurity: No Food Insecurity (12/31/2022)   Hunger Vital Sign    Worried About Running Out of Food in the Last Year: Never true    Ran  Out of Food in the Last Year: Never true  Transportation Needs: No Transportation Needs (12/31/2022)   PRAPARE - Administrator, Civil Service (Medical): No    Lack of Transportation (Non-Medical): No  Physical Activity: Sufficiently Active (12/31/2022)   Exercise Vital Sign    Days of Exercise per Week: 7 days    Minutes of Exercise per Session: 30 min  Stress: No Stress Concern Present (12/31/2022)  Destiny Norman    Feeling of Stress : Not at all  Social Connections: Moderately Isolated (12/31/2022)   Social Connection and Isolation Panel [NHANES]    Frequency of Communication with Friends and Family: More than three times a week    Frequency of Social Gatherings with Friends and Family: More than three times a week    Attends Religious Services: Never    Database administrator or Organizations: No    Attends Banker Meetings: Never    Marital Status: Married     Review of Systems   Gen: Denies fever, chills, anorexia. Denies fatigue, weakness, weight loss.  CV: Denies chest pain, palpitations, syncope, peripheral edema, and claudication. Resp: Denies dyspnea at rest, cough, wheezing, coughing up blood, and pleurisy. GI: See HPI Derm: Denies rash, itching, dry skin Psych: Denies depression, anxiety, memory loss, confusion. No homicidal or suicidal ideation.  Heme: Denies bruising, bleeding, and enlarged lymph nodes.  Physical Exam   BP 134/80 (BP Location: Right Arm, Patient Position: Sitting, Cuff Size: Normal)   Pulse (!) 54   Temp 97.8 F (36.6 C) (Temporal)   Ht 5' (1.524 m)   Wt 125 lb 6.4 oz (56.9 kg)   BMI 24.49 kg/m   General:   Alert and oriented. No distress noted. Pleasant and cooperative.  Head:  Normocephalic and atraumatic. Eyes:  Conjuctiva clear without scleral icterus. Mouth:  Oral mucosa pink and moist. Good dentition. No lesions. Lungs:  Clear to auscultation  bilaterally. No wheezes, rales, or rhonchi. No distress.  Heart:  S1, S2 present without murmurs appreciated.  Abdomen:  +BS, soft, non-tender and non-distended. No rebound or guarding. No HSM or masses noted. Rectal: deferred Msk:  Symmetrical without gross deformities. Normal posture. Extremities:  Without edema. Neurologic:  Alert and  oriented x4 Psych:  Alert and cooperative. Normal mood and affect.  Assessment  Geri Ursin is a 77 y.o. female with a history of IDA, HTN, breast cancer, HLD, vitamin D deficiency, GERD, and constipation presenting today with complaint of nausea/vomiting, and 1 occurrence of diarrhea.  IDA: Mildly anemic previously with hemoglobin of 11 with component of iron deficiency and B12 deficiency in May 2024.  Currently on iron every other day and B12 supplementation.  Denies any GI bleeding.  Has had previous chronic use of NSAIDs.  Again today discussed EGD and colonoscopy to further evaluate her IDA and with further discussion she has agreed to proceed given her recent acute illness as well.  She continues to suspect that her IDA is secondary to her vegetarian lifestyle.  Reassuringly her iFOBT was negative in June.  Recent labs with hemoglobin stable at 12.3 while on iron therapy and normal iron panel.  We discussed the difficulties with her last colonoscopy and discussed that we could potentially encounter this again but given her IDA it is recommended that we proceed with upper endoscopy and colonoscopy.  She has agreed.  We discussed potential complications today in detail.  Early Satiety, GERD: No complaints of any significant early satiety since her last visit however over the last several days she has been experiencing frequent nausea with 1 episode of emesis and diarrhea after eating an apple freighter.  Had immediate discomfort to the epigastrium.  After discussion it appears she may have a potential sensitivity to cinnamon as a trigger.  Given the  symptoms as well as her anemia we again discussed upper endoscopy she has agreed to  proceed.  Differentials include gastritis, duodenitis, H. pylori, gastric outlet obstruction, PUD, or potential malignancy.  Constipation: Fairly well-controlled with Colace nightly and MiraLAX as needed.  Likely exacerbated by iron every other day.  N/V: Likely exacerbated by reflux episode, potential trigger of cinnamon.  Feeling improved today, started to feel better last night.  Advised to continue current regimen with PPI twice daily.  PLAN   Proceed with upper endoscopy and colonoscopy with propofol by Dr. Levon Hedger in near future: the risks, benefits, and alternatives have been discussed with the patient in detail. The patient states understanding and desires to proceed. ASA 2 BMP prior Hold iron x 7 days prior to procedures.   Continue omeprazole 20 mg BID GERD diet MiraLAX daily as needed Continue Colace nightly Avoid NSAIDs Follow-up in 3 months   Brooke Bonito, MSN, FNP-BC, AGACNP-BC [facility]  I have reviewed the note and agree with the APP's assessment as described in this progress note  Katrinka Blazing, MD Gastroenterology and Hepatology [facility]

## 2023-04-11 DIAGNOSIS — R112 Nausea with vomiting, unspecified: Secondary | ICD-10-CM | POA: Diagnosis not present

## 2023-04-11 DIAGNOSIS — K219 Gastro-esophageal reflux disease without esophagitis: Secondary | ICD-10-CM | POA: Diagnosis not present

## 2023-04-11 DIAGNOSIS — D509 Iron deficiency anemia, unspecified: Secondary | ICD-10-CM | POA: Diagnosis not present

## 2023-04-12 LAB — BASIC METABOLIC PANEL WITH GFR
BUN/Creatinine Ratio: 13 (ref 12–28)
BUN: 13 mg/dL (ref 8–27)
CO2: 24 mmol/L (ref 20–29)
Calcium: 9.4 mg/dL (ref 8.7–10.3)
Chloride: 101 mmol/L (ref 96–106)
Creatinine, Ser: 0.97 mg/dL (ref 0.57–1.00)
Glucose: 84 mg/dL (ref 70–99)
Potassium: 4.3 mmol/L (ref 3.5–5.2)
Sodium: 140 mmol/L (ref 134–144)
eGFR: 61 mL/min/{1.73_m2}

## 2023-04-18 ENCOUNTER — Ambulatory Visit: Payer: Medicare PPO | Admitting: Family Medicine

## 2023-04-18 VITALS — BP 165/78 | HR 51 | Temp 97.7°F | Ht 60.0 in | Wt 127.0 lb

## 2023-04-18 DIAGNOSIS — R519 Headache, unspecified: Secondary | ICD-10-CM | POA: Diagnosis not present

## 2023-04-18 DIAGNOSIS — I10 Essential (primary) hypertension: Secondary | ICD-10-CM | POA: Diagnosis not present

## 2023-04-18 DIAGNOSIS — Z8619 Personal history of other infectious and parasitic diseases: Secondary | ICD-10-CM | POA: Diagnosis not present

## 2023-04-18 MED ORDER — VALACYCLOVIR HCL 1 G PO TABS: ORAL_TABLET | ORAL | 3 refills | Status: AC

## 2023-04-18 MED ORDER — BACLOFEN 10 MG PO TABS: 10.0000 mg | ORAL_TABLET | Freq: Every day | ORAL | 0 refills | Status: DC

## 2023-04-18 NOTE — Assessment & Plan Note (Signed)
Valacyclovir as needed.

## 2023-04-18 NOTE — Progress Notes (Signed)
Subjective:  Patient ID: Destiny Norman, female    DOB: Oct 11, 1945  Age: 77 y.o. MRN: [id number]  CC: Follow-up   HPI:  77 year old female presents for follow-up.  Patient's blood pressure is elevated here today.  She states that her blood pressure is often elevated at the doctor's office and is typically well-controlled at home.  She is compliant with lisinopril/HCTZ and metoprolol.  I advised her that she needs to keep a log of her blood pressures at home.  Patient reports that she has recurrent cold sores.  She would like a prescription for valacyclovir to be used as needed.  Patient reports that she continues to have ongoing issues with occipital headache.  She has been seen by neurology as well as the [facility] center in the past.  Has tried Topamax, nortriptyline, trigger point injections.  He is currently on gabapentin.  She states that she has not had significant relief.  She states that her headache is essentially constant.  She wakes up with a headache.  She states that this has been going on for couple of years.  Negative CT head in 2022.  Patient Active Problem List   Diagnosis Date Noted   H/O cold sores 04/18/2023   Tortuous colon 11/08/2022   Iron deficiency anemia 11/08/2022   Occipital headache 10/14/2022   OSA on CPAP 10/14/2022   Essential hypertension 12/23/2020   Mixed hyperlipidemia 12/23/2020   Prediabetes 12/23/2020    Social Hx   Social History   Socioeconomic History   Marital status: Married    Spouse name: Not on file   Number of children: Not on file   Years of education: Not on file   Highest education level: Not on file  Occupational History   Not on file  Tobacco Use   Smoking status: Never   Smokeless tobacco: Never  Substance and Sexual Activity   Alcohol use: Yes   Drug use: No   Sexual activity: Yes  Other Topics Concern   Not on file  Social History Narrative   Not on file   Social Determinants of Health    Financial Resource Strain: Low Risk  (12/31/2022)   Overall Financial Resource Strain (CARDIA)    Difficulty of Paying Living Expenses: Not hard at all  Food Insecurity: No Food Insecurity (12/31/2022)   Hunger Vital Sign    Worried About Running Out of Food in the Last Year: Never true    Ran Out of Food in the Last Year: Never true  Transportation Needs: No Transportation Needs (12/31/2022)   PRAPARE - Administrator, Civil Service (Medical): No    Lack of Transportation (Non-Medical): No  Physical Activity: Sufficiently Active (12/31/2022)   Exercise Vital Sign    Days of Exercise per Week: 7 days    Minutes of Exercise per Session: 30 min  Stress: No Stress Concern Present (12/31/2022)   [employer] of Occupational Health - Occupational Stress Questionnaire    Feeling of Stress : Not at all  Social Connections: Moderately Isolated (12/31/2022)   Social Connection and Isolation Panel [NHANES]    Frequency of Communication with Friends and Family: More than three times a week    Frequency of Social Gatherings with Friends and Family: More than three times a week    Attends Religious Services: Never    Database administrator or Organizations: No    Attends Banker Meetings: Never    Marital Status: Married  Review of Systems Per HPI  Objective:  BP (!) 165/78   Pulse (!) 51   Temp 97.7 F (36.5 C)   Ht 5' (1.524 m)   Wt 127 lb (57.6 kg)   SpO2 99%   BMI 24.80 kg/m      04/18/2023   10:29 AM 04/18/2023    9:49 AM 04/05/2023   11:10 AM  BP/Weight  Systolic BP 165 154 134  Diastolic BP 78 79 80  Wt. (Lbs)  127 125.4  BMI  24.8 kg/m2 24.49 kg/m2    Physical Exam Vitals and nursing note reviewed.  Constitutional:      Appearance: Normal appearance.  HENT:     Head: Normocephalic and atraumatic.  Cardiovascular:     Rate and Rhythm: Normal rate and regular rhythm.  Pulmonary:     Effort: Pulmonary effort is normal.     Breath sounds:  Normal breath sounds. No wheezing, rhonchi or rales.  Neurological:     Mental Status: She is alert.  Psychiatric:        Mood and Affect: Mood normal.        Behavior: Behavior normal.     Lab Results  Component Value Date   WBC 5.1 02/07/2023   HGB 12.3 02/07/2023   HCT 38.0 02/07/2023   PLT 288 02/07/2023   GLUCOSE 84 04/11/2023   CHOL 159 10/14/2022   TRIG 242 (H) 10/14/2022   HDL 40 10/14/2022   LDLCALC 79 10/14/2022   ALT 10 10/14/2022   AST 19 10/14/2022   NA 140 04/11/2023   K 4.3 04/11/2023   CL 101 04/11/2023   CREATININE 0.97 04/11/2023   BUN 13 04/11/2023   CO2 24 04/11/2023   TSH 2.630 04/13/2021   HGBA1C 6.3 (H) 10/14/2022     Assessment & Plan:   Problem List Items Addressed This Visit       Cardiovascular and Mediastinum   Essential hypertension - Primary    BP not at goal today.  Advised to keep a log of home readings.  Patient assures me that her blood pressure readings are well-controlled when she is not in our office.  Continue metoprolol and lisinopril/HCTZ.        Other   Occipital headache    Chronic problem.  She has had no significant improvement with multiple prior therapeutic options.  Referring to [facility] neurology, Dr. Everlena Cooper.  Trial of baclofen.      Relevant Medications   baclofen (LIORESAL) 10 MG tablet   Other Relevant Orders   Ambulatory referral to Neurology   H/O cold sores    Valacyclovir as needed.       Meds ordered this encounter  Medications   baclofen (LIORESAL) 10 MG tablet    Sig: Take 1 tablet (10 mg total) by mouth at bedtime.    Dispense:  30 each    Refill:  0   valACYclovir (VALTREX) 1000 MG tablet    Sig: 2 g twice daily for 1 day. At the first sign of cold sore.    Dispense:  20 tablet    Refill:  3    Follow-up:  3-6 months  Dymir Neeson Adriana Simas DO [facility]

## 2023-04-18 NOTE — Assessment & Plan Note (Signed)
Chronic problem.  She has had no significant improvement with multiple prior therapeutic options.  Referring to [facility] neurology, Dr. Everlena Cooper.  Trial of baclofen.

## 2023-04-18 NOTE — Patient Instructions (Signed)
Trial of Baclofen.  Referral placed.  Valtrex sent in.  Follow up in 3-6 months.

## 2023-04-18 NOTE — Assessment & Plan Note (Signed)
BP not at goal today.  Advised to keep a log of home readings.  Patient assures me that her blood pressure readings are well-controlled when she is not in our office.  Continue metoprolol and lisinopril/HCTZ.

## 2023-04-20 ENCOUNTER — Telehealth: Payer: Self-pay

## 2023-04-20 NOTE — Telephone Encounter (Signed)
Called pt to inform her the referral can not be accepted due to her being established at [facility]

## 2023-04-25 ENCOUNTER — Telehealth: Payer: Self-pay | Admitting: *Deleted

## 2023-04-25 NOTE — Telephone Encounter (Signed)
Copied from CRM [id number]. Topic: Referral - Status >> Apr 25, 2023  1:41 PM Dennison Nancy wrote: Reason for CRM: patient requested on status of the referral for neurologist for Dr. Everlena Cooper need clarification if patient need to call to schedule the appointment with the specialist or will Dr. Everlena Cooper be calling the patient . Patient want to know why she was denied to see Dr. Everlena Cooper since she was recommended , patient would like someone to call her to explain exactly what she need to do and explain why she is denied

## 2023-04-25 NOTE — Telephone Encounter (Signed)
Copied from CRM [id number]. Topic: General - Other >> Apr 20, 2023  5:59 PM Suzette B wrote: Reason for CRM: Patient referral was with Dr. Everlena Cooper and the patient was denied and concerned because is still having the headaches, very concerned and would like for someone to reach out to her from the office to inform her of what she needs to do

## 2023-04-25 NOTE — Telephone Encounter (Signed)
Destiny Sams, DO     She was denied because she is a patient of [facility].

## 2023-04-25 NOTE — Telephone Encounter (Signed)
Patient notified

## 2023-04-25 NOTE — Telephone Encounter (Signed)
Destiny Sams, DO     The neurology office at [facility] advised that she will have to see [facility] neurology regarding this as she is already a patient there. I would recommend that she call and schedule an appt with them.

## 2023-04-27 ENCOUNTER — Ambulatory Visit (HOSPITAL_COMMUNITY)
Admission: RE | Admit: 2023-04-27 | Discharge: 2023-04-27 | Disposition: A | Discharge status: Home / Self Care | Payer: Medicare PPO | Attending: Gastroenterology | Admitting: Gastroenterology

## 2023-04-27 ENCOUNTER — Encounter (HOSPITAL_COMMUNITY): Payer: Self-pay | Admitting: Gastroenterology

## 2023-04-27 ENCOUNTER — Other Ambulatory Visit: Payer: Self-pay

## 2023-04-27 ENCOUNTER — Ambulatory Visit (HOSPITAL_BASED_OUTPATIENT_CLINIC_OR_DEPARTMENT_OTHER): Payer: Medicare PPO | Admitting: Certified Registered"

## 2023-04-27 ENCOUNTER — Encounter (HOSPITAL_COMMUNITY)
Admission: RE | Disposition: A | Discharge status: Home / Self Care | Payer: Self-pay | Source: Home / Self Care | Attending: Gastroenterology

## 2023-04-27 ENCOUNTER — Ambulatory Visit (HOSPITAL_COMMUNITY): Payer: Medicare PPO | Admitting: Certified Registered"

## 2023-04-27 DIAGNOSIS — R112 Nausea with vomiting, unspecified: Secondary | ICD-10-CM | POA: Diagnosis not present

## 2023-04-27 DIAGNOSIS — G4730 Sleep apnea, unspecified: Secondary | ICD-10-CM | POA: Diagnosis not present

## 2023-04-27 DIAGNOSIS — R12 Heartburn: Secondary | ICD-10-CM | POA: Insufficient documentation

## 2023-04-27 DIAGNOSIS — K6389 Other specified diseases of intestine: Secondary | ICD-10-CM | POA: Diagnosis not present

## 2023-04-27 DIAGNOSIS — K319 Disease of stomach and duodenum, unspecified: Secondary | ICD-10-CM | POA: Diagnosis not present

## 2023-04-27 DIAGNOSIS — K219 Gastro-esophageal reflux disease without esophagitis: Secondary | ICD-10-CM | POA: Insufficient documentation

## 2023-04-27 DIAGNOSIS — Z79899 Other long term (current) drug therapy: Secondary | ICD-10-CM | POA: Insufficient documentation

## 2023-04-27 DIAGNOSIS — D509 Iron deficiency anemia, unspecified: Secondary | ICD-10-CM | POA: Insufficient documentation

## 2023-04-27 DIAGNOSIS — K5900 Constipation, unspecified: Secondary | ICD-10-CM | POA: Diagnosis not present

## 2023-04-27 DIAGNOSIS — K2950 Unspecified chronic gastritis without bleeding: Secondary | ICD-10-CM | POA: Insufficient documentation

## 2023-04-27 DIAGNOSIS — I10 Essential (primary) hypertension: Secondary | ICD-10-CM | POA: Insufficient documentation

## 2023-04-27 DIAGNOSIS — D649 Anemia, unspecified: Secondary | ICD-10-CM | POA: Diagnosis not present

## 2023-04-27 DIAGNOSIS — K648 Other hemorrhoids: Secondary | ICD-10-CM | POA: Diagnosis not present

## 2023-04-27 DIAGNOSIS — K31A19 Gastric intestinal metaplasia without dysplasia, unspecified site: Secondary | ICD-10-CM | POA: Diagnosis not present

## 2023-04-27 DIAGNOSIS — Z853 Personal history of malignant neoplasm of breast: Secondary | ICD-10-CM | POA: Insufficient documentation

## 2023-04-27 DIAGNOSIS — E785 Hyperlipidemia, unspecified: Secondary | ICD-10-CM | POA: Insufficient documentation

## 2023-04-27 DIAGNOSIS — K3189 Other diseases of stomach and duodenum: Secondary | ICD-10-CM | POA: Diagnosis not present

## 2023-04-27 DIAGNOSIS — R1010 Upper abdominal pain, unspecified: Secondary | ICD-10-CM | POA: Insufficient documentation

## 2023-04-27 HISTORY — PX: COLONOSCOPY WITH PROPOFOL: SHX5780

## 2023-04-27 HISTORY — PX: ESOPHAGOGASTRODUODENOSCOPY (EGD) WITH PROPOFOL: SHX5813

## 2023-04-27 HISTORY — PX: BIOPSY: SHX5522

## 2023-04-27 LAB — HM COLONOSCOPY

## 2023-04-27 SURGERY — COLONOSCOPY WITH PROPOFOL
Anesthesia: General

## 2023-04-27 MED ORDER — PHENYLEPHRINE 80 MCG/ML (10ML) SYRINGE FOR IV PUSH (FOR BLOOD PRESSURE SUPPORT)
PREFILLED_SYRINGE | INTRAVENOUS | Status: AC
  Filled 2023-04-27: qty 10

## 2023-04-27 MED ORDER — PROPOFOL 10 MG/ML IV BOLUS
INTRAVENOUS | Status: DC | PRN
  Administered 2023-04-27: 50 mg via INTRAVENOUS
  Administered 2023-04-27: 100 mg via INTRAVENOUS

## 2023-04-27 MED ORDER — FAMOTIDINE 20 MG PO TABS: 20.0000 mg | ORAL_TABLET | Freq: Every evening | ORAL | 1 refills | Status: DC

## 2023-04-27 MED ORDER — PROPOFOL 500 MG/50ML IV EMUL
INTRAVENOUS | Status: DC | PRN
  Administered 2023-04-27: 150 ug/kg/min via INTRAVENOUS

## 2023-04-27 MED ORDER — LACTATED RINGERS IV SOLN: INTRAVENOUS | Status: DC | PRN

## 2023-04-27 MED ORDER — LIDOCAINE HCL (PF) 2 % IJ SOLN
INTRAMUSCULAR | Status: DC | PRN
  Administered 2023-04-27: 50 mg via INTRADERMAL

## 2023-04-27 MED ORDER — PHENYLEPHRINE 80 MCG/ML (10ML) SYRINGE FOR IV PUSH (FOR BLOOD PRESSURE SUPPORT)
PREFILLED_SYRINGE | INTRAVENOUS | Status: DC | PRN
  Administered 2023-04-27: 80 ug via INTRAVENOUS
  Administered 2023-04-27 (×2): 160 ug via INTRAVENOUS
  Administered 2023-04-27: 80 ug via INTRAVENOUS
  Administered 2023-04-27 (×2): 160 ug via INTRAVENOUS

## 2023-04-27 MED ORDER — EPHEDRINE SULFATE-NACL 50-0.9 MG/10ML-% IV SOSY
PREFILLED_SYRINGE | INTRAVENOUS | Status: DC | PRN
  Administered 2023-04-27 (×3): 5 mg via INTRAVENOUS

## 2023-04-27 MED ORDER — EPHEDRINE 5 MG/ML INJ
INTRAVENOUS | Status: AC
Start: 2023-04-27 — End: ?
  Filled 2023-04-27: qty 5

## 2023-04-27 NOTE — Op Note (Addendum)
[facility] Patient Name: Destiny Norman Procedure Date: 04/27/2023 11:40 AM MRN: [id number] Date of Birth: 09/06/1945 Attending MD: Katrinka Blazing , , [id number] CSN: [id number] Age: 77 Admit Type: Outpatient Procedure:                Upper GI endoscopy Indications:              Upper abdominal pain, Iron deficiency anemia,                            Heartburn Providers:                Katrinka Blazing, Edrick Kins, RN, Zena Amos Referring MD:              Medicines:                Monitored Anesthesia Care Complications:            No immediate complications. Estimated Blood Loss:     Estimated blood loss: none. Procedure:                Pre-Anesthesia Assessment:                           - Prior to the procedure, a History and Physical                            was performed, and patient medications, allergies                            and sensitivities were reviewed. The patient's                            tolerance of previous anesthesia was reviewed.                           - The risks and benefits of the procedure and the                            sedation options and risks were discussed with the                            patient. All questions were answered and informed                            consent was obtained.                           - ASA Grade Assessment: II - A patient with mild                            systemic disease.                           After obtaining informed consent, the endoscope was                            passed under direct vision. Throughout the  procedure, the patient's blood pressure, pulse, and                            oxygen saturations were monitored continuously. The                            [id number]-[id number] ([id number]) scope was introduced through the                            mouth, and advanced to the second part of duodenum.                            The upper GI endoscopy was  accomplished without                            difficulty. The patient tolerated the procedure                            well. Scope In: 12:19:27 PM Scope Out: 12:24:11 PM Total Procedure Duration: 0 hours 4 minutes 44 seconds  Findings:      The esophagus was normal.      The gastroesophageal flap valve was visualized endoscopically and       classified as Hill Grade II (fold present, opens with respiration).      The entire examined stomach was normal. Biopsies were taken with a cold       forceps for Helicobacter pylori testing.      The examined duodenum was normal. Biopsies were taken with a cold       forceps for histology. Impression:               - Normal esophagus.                           - Normal stomach. Biopsied.                           - Normal examined duodenum. Biopsied. Moderate Sedation:      Per Anesthesia Care Recommendation:           - Discharge patient to home (ambulatory).                           - Resume previous diet.                           - Await pathology results.                           - Continue present medications.                           - Can start Pepcid 20 mg at bedtime.                           - If persistent abdominal discomfort or nausea,  proceed with gastric emptying study. Procedure Code(s):        --- Professional ---                           [phone], Esophagogastroduodenoscopy, flexible,                            transoral; with biopsy, single or multiple Diagnosis Code(s):        --- Professional ---                           R10.10, Upper abdominal pain, unspecified                           D50.9, Iron deficiency anemia, unspecified                           R12, Heartburn CPT copyright 2022 American Medical Association. All rights reserved. The codes documented in this report are preliminary and upon coder review may  be revised to meet current compliance requirements. Katrinka Blazing,  MD Katrinka Blazing,  04/27/2023 12:29:17 PM This report has been signed electronically. Number of Addenda: 0

## 2023-04-27 NOTE — Anesthesia Postprocedure Evaluation (Signed)
Anesthesia Post Note  Patient: Destiny Norman  Procedure(s) Performed: COLONOSCOPY WITH PROPOFOL ESOPHAGOGASTRODUODENOSCOPY (EGD) WITH PROPOFOL BIOPSY  Patient location during evaluation: PACU Anesthesia Type: General Level of consciousness: awake and alert Pain management: pain level controlled Vital Signs Assessment: post-procedure vital signs reviewed and stable Respiratory status: spontaneous breathing, nonlabored ventilation, respiratory function stable and patient connected to nasal cannula oxygen Cardiovascular status: blood pressure returned to baseline and stable Postop Assessment: no apparent nausea or vomiting Anesthetic complications: no   There were no known notable events for this encounter.   Last Vitals:  Vitals:   04/27/23 1127 04/27/23 1254  BP: (!) 147/72 (!) 104/57  Pulse: 60 73  Resp: 14 15  Temp: 37.1 C (!) 36.4 C  SpO2: 97% 98%    Last Pain:  Vitals:   04/27/23 1254  TempSrc: Axillary  PainSc: 0-No pain                 Kairen Hallinan L Kalissa Grays

## 2023-04-27 NOTE — Discharge Instructions (Addendum)
You are being discharged to home.  Resume your previous diet.  We are waiting for your pathology results.  Continue your present medications.  Can start Pepcid 20 mg at bedtime. If persistent abdominal discomfort or nausea, proceed with gastric emptying study. Your physician has indicated that a repeat colonoscopy is not recommended due to your current age (64 years or older) for screening purposes.  Continue oral iron supplementation.

## 2023-04-27 NOTE — Anesthesia Preprocedure Evaluation (Addendum)
Anesthesia Evaluation  Patient identified by MRN, date of birth, ID band Patient awake    Reviewed: Allergy & Precautions, H&P , NPO status , Patient's Chart, lab work & pertinent test results, reviewed documented beta blocker date and time   Airway Mallampati: II  TM Distance: >3 FB Neck ROM: full    Dental no notable dental hx. (+) Dental Advisory Given, Teeth Intact   Pulmonary sleep apnea    Pulmonary exam normal breath sounds clear to auscultation       Cardiovascular Exercise Tolerance: Good hypertension, Normal cardiovascular exam Rhythm:regular Rate:Normal     Neuro/Psych  Headaches  negative psych ROS   GI/Hepatic negative GI ROS, Neg liver ROS,,,  Endo/Other  negative endocrine ROS    Renal/GU negative Renal ROS  negative genitourinary   Musculoskeletal negative musculoskeletal ROS (+)    Abdominal   Peds negative pediatric ROS (+)  Hematology negative hematology ROS (+)   Anesthesia Other Findings Breast cancer  Reproductive/Obstetrics negative OB ROS                             Anesthesia Physical Anesthesia Plan  ASA: 2  Anesthesia Plan: General   Post-op Pain Management: Minimal or no pain anticipated   Induction: Intravenous  PONV Risk Score and Plan: Propofol infusion  Airway Management Planned: Nasal Cannula and Natural Airway  Additional Equipment: None  Intra-op Plan:   Post-operative Plan:   Informed Consent: I have reviewed the patients History and Physical, chart, labs and discussed the procedure including the risks, benefits and alternatives for the proposed anesthesia with the patient or authorized representative who has indicated his/her understanding and acceptance.     Dental Advisory Given  Plan Discussed with: CRNA  Anesthesia Plan Comments:         Anesthesia Quick Evaluation

## 2023-04-27 NOTE — Transfer of Care (Signed)
Immediate Anesthesia Transfer of Care Note  Patient: Destiny Norman  Procedure(s) Performed: COLONOSCOPY WITH PROPOFOL ESOPHAGOGASTRODUODENOSCOPY (EGD) WITH PROPOFOL BIOPSY  Patient Location: Endoscopy Unit  Anesthesia Type:General  Level of Consciousness: awake  Airway & Oxygen Therapy: Patient Spontanous Breathing  Post-op Assessment: Report given to RN and Post -op Vital signs reviewed and stable  Post vital signs: Reviewed and stable  Last Vitals:  Vitals Value Taken Time  BP 104/57 04/27/23 1254  Temp 36.4 C 04/27/23 1254  Pulse 73 04/27/23 1254  Resp 15 04/27/23 1254  SpO2 98 % 04/27/23 1254    Last Pain:  Vitals:   04/27/23 1254  TempSrc: Axillary  PainSc: 0-No pain      Patients Stated Pain Goal: 4 (04/27/23 1127)  Complications: No notable events documented.

## 2023-04-27 NOTE — Interval H&P Note (Signed)
History and Physical Interval Note:  04/27/2023 11:34 AM  Destiny Norman  has presented today for surgery, with the diagnosis of anemia,GERD,N/V.  The various methods of treatment have been discussed with the patient and family. After consideration of risks, benefits and other options for treatment, the patient has consented to  Procedure(s) with comments: COLONOSCOPY WITH PROPOFOL (N/A) - 12:45 pm, asa 2 ESOPHAGOGASTRODUODENOSCOPY (EGD) WITH PROPOFOL (N/A) as a surgical intervention.  The patient's history has been reviewed, patient examined, no change in status, stable for surgery.  I have reviewed the patient's chart and labs.  Questions were answered to the patient's satisfaction.     Katrinka Blazing Mayorga

## 2023-04-27 NOTE — Op Note (Signed)
[facility] Patient Name: Destiny Norman Procedure Date: 04/27/2023 11:40 AM MRN: [id number] Date of Birth: 1946/02/22 Attending MD: Katrinka Blazing , , [id number] CSN: [id number] Age: 77 Admit Type: Outpatient Procedure:                Colonoscopy Indications:              Iron deficiency anemia Providers:                Katrinka Blazing, Edrick Kins, RN, Zena Amos Referring MD:              Medicines:                Monitored Anesthesia Care Complications:            No immediate complications. Estimated Blood Loss:     Estimated blood loss: none. Procedure:                Pre-Anesthesia Assessment:                           - Prior to the procedure, a History and Physical                            was performed, and patient medications, allergies                            and sensitivities were reviewed. The patient's                            tolerance of previous anesthesia was reviewed.                           - The risks and benefits of the procedure and the                            sedation options and risks were discussed with the                            patient. All questions were answered and informed                            consent was obtained.                           - ASA Grade Assessment: II - A patient with mild                            systemic disease.                           After obtaining informed consent, the colonoscope                            was passed under direct vision. Throughout the                            procedure, the patient's blood pressure, pulse,  and                            oxygen saturations were monitored continuously. The                            [id number]-HQ190L ([id number]) scope was introduced through                            the anus and advanced to the the terminal ileum.                            The colonoscopy was performed without difficulty.                            The patient tolerated the  procedure well. The                            quality of the bowel preparation was adequate. Scope In: 12:29:58 PM Scope Out: 12:52:09 PM Scope Withdrawal Time: 0 hours 11 minutes 43 seconds  Total Procedure Duration: 0 hours 22 minutes 11 seconds  Findings:      The perianal and digital rectal examinations were normal.      The terminal ileum appeared normal.      One 20 to 25 mm submucosal nodule was found in the cecum. This nodule       was soft to gently palpation with the biopsy forceps. Biopsies were       taken with a cold forceps for histology.      A moderate amount of solid food was found in the cecum, which was move       with a Lucina Mellow net to allow adequate inspection.      Non-bleeding internal hemorrhoids were found during retroflexion. The       hemorrhoids were medium-sized. Impression:               - The examined portion of the ileum was normal.                           - Submucosal nodule in the cecum. Biopsied.                           - Non-bleeding internal hemorrhoids. Moderate Sedation:      Per Anesthesia Care Recommendation:           - Discharge patient to home (ambulatory).                           - Resume previous diet.                           - Await pathology results.                           - Repeat colonoscopy is not recommended due to                            current age (40 years  or older) for screening                            purposes.                           -Continue oral iron supplementation. Procedure Code(s):        --- Professional ---                           [phone], Colonoscopy, flexible; with biopsy, single                            or multiple Diagnosis Code(s):        --- Professional ---                           K64.8, Other hemorrhoids                           K63.89, Other specified diseases of intestine                           D50.9, Iron deficiency anemia, unspecified CPT copyright 2022 American Medical  Association. All rights reserved. The codes documented in this report are preliminary and upon coder review may  be revised to meet current compliance requirements. Katrinka Blazing, MD Katrinka Blazing,  04/27/2023 12:59:32 PM This report has been signed electronically. Number of Addenda: 0

## 2023-04-27 NOTE — Anesthesia Procedure Notes (Signed)
Date/Time: 04/27/2023 12:14 PM  Performed by: Julian Reil, CRNAPre-anesthesia Checklist: Patient identified, Emergency Drugs available, Suction available and Patient being monitored Patient Re-evaluated:Patient Re-evaluated prior to induction Oxygen Delivery Method: Nasal cannula Induction Type: IV induction Placement Confirmation: positive ETCO2

## 2023-04-28 ENCOUNTER — Encounter (INDEPENDENT_AMBULATORY_CARE_PROVIDER_SITE_OTHER): Payer: Self-pay | Admitting: *Deleted

## 2023-04-29 LAB — SURGICAL PATHOLOGY

## 2023-05-02 ENCOUNTER — Telehealth: Payer: Self-pay | Admitting: Adult Health

## 2023-05-02 ENCOUNTER — Telehealth: Payer: Self-pay

## 2023-05-02 NOTE — Addendum Note (Signed)
Addended by: Guy Begin on: 05/02/2023 03:14 PM   Modules accepted: Orders

## 2023-05-02 NOTE — Telephone Encounter (Signed)
Called pt to offer work in appt w/Dr. [name] and pt stated that she doesn't prefer to see Dr. [name] & would rather see Megan. I told her I would route to [facility] np and ask is she would be agreeable to seeing her. Pt voiced gratitude and understanding of all discussed

## 2023-05-02 NOTE — Telephone Encounter (Signed)
Patients husband brought in POA to be sent to scan center. He advised her condition is worsening with headaches. Pt uses Cpap and wanted to know if that could be causing the change. Still taking her medication  regularly so he wanted to speak to someone to see if she could be seen for a second opinion. (Primary sent out second opinion and she was denied)

## 2023-05-02 NOTE — Telephone Encounter (Signed)
I called pts husbands # and LMVM for him that was calling back re: referral.

## 2023-05-02 NOTE — Telephone Encounter (Signed)
FYI  Patient called and advised that she wanted to be released from Dr Catalina Gravel care in order to see another neurologist. I advised her that I can put in a note in her chart and advise Dr [name] of same.

## 2023-05-02 NOTE — Telephone Encounter (Signed)
Husband called back for pt.  They are requesting a second opinion for her headaches.  Her pcp made request LBN but denied as we were seeing her.  I relayed will forward to [facility] NP who has seen her last (Dr. [name]) has see her in past for headaches.  I did relayed last 12/2022 mychart VV for cpap pt was doing ok no c/o.  Husband says she is pretty stoic.  Would appreciate the referral.

## 2023-05-02 NOTE — Telephone Encounter (Signed)
Saw her in 2022 for headache, normal CT head  was managed by Megan/Dr. Lucia Gaskins for obstructive sleep apnea, CPAP since.   Ok to see patient for headache with megan or with me, ok virtual visit.

## 2023-05-02 NOTE — Telephone Encounter (Signed)
Noted and sent to Dr. [name] as Lorain Childes

## 2023-05-03 NOTE — Telephone Encounter (Signed)
OK to be released from my care.

## 2023-05-03 NOTE — Telephone Encounter (Signed)
Returned call to pt and offered appt w/megan millikan and pt stated that her pcp referred her to another neurologist and that she will give [place] a call back if she prefers to take the megan appt.  She stated that she no longer wants to see [facility] or dr. [name] but may want to remain a patient here.  She stated [name] was 45 mins late in visit w/her. And not happy w/Dr. [name].

## 2023-05-03 NOTE — Telephone Encounter (Signed)
Noted. Happy to see her if she desires

## 2023-05-04 ENCOUNTER — Telehealth: Payer: Self-pay

## 2023-05-04 DIAGNOSIS — R519 Headache, unspecified: Secondary | ICD-10-CM

## 2023-05-04 NOTE — Telephone Encounter (Signed)
Reason for CRM: PT states Dr. Adriana Simas referred her to Dr. Everlena Cooper (neurology) but PT states Dr. Everlena Cooper needs her to leave [facility] neurology before she can be seen as PT by him. PT states she likes to get her CPAP done at [facility] and would like to discuss this with Dr. Adriana Simas or his nurse.   Please Advise

## 2023-05-05 ENCOUNTER — Encounter (INDEPENDENT_AMBULATORY_CARE_PROVIDER_SITE_OTHER): Payer: Self-pay | Admitting: *Deleted

## 2023-05-05 ENCOUNTER — Encounter (HOSPITAL_COMMUNITY): Payer: Self-pay | Admitting: Gastroenterology

## 2023-05-05 NOTE — Addendum Note (Signed)
Addended by: Margaretha Sheffield on: 05/05/2023 09:48 AM   Modules accepted: Orders

## 2023-05-05 NOTE — Telephone Encounter (Signed)
Just for clarification,  If patient wishes to be release from our neurological care, she can still receive care for her sleep concerns here.  Spoke with patient to advise her of that and she let me know that Aundra Millet had approved seeing her for her headaches. Reading back, it looks like that is the case. I let her know I could get her set up with [facility] for a follow up and also asked if in the future, she had new concerns pop up or if she needed to see the MD for any reason, if she was amenable to seeing Dr [name]. She did say in that case, she was fine with seeing Dr [name] as needed, but she preferred to see [facility].

## 2023-05-05 NOTE — Telephone Encounter (Signed)
Referral ordered in EPIC. 

## 2023-05-05 NOTE — Telephone Encounter (Signed)
Referral coordinator stated she will reach out to patient to clarify referral

## 2023-05-30 ENCOUNTER — Encounter: Payer: Self-pay | Admitting: Gastroenterology

## 2023-05-30 ENCOUNTER — Telehealth: Payer: Self-pay | Admitting: Adult Health

## 2023-05-30 NOTE — Telephone Encounter (Signed)
 Pt LVM at3:30 pm to check to see if can get a later appt on that day.

## 2023-06-28 NOTE — Progress Notes (Deleted)
 PATIENT: Destiny Norman DOB: [birth year]  REASON FOR VISIT: follow up HISTORY FROM: patient PRIMARY NEUROLOGIST: Dr. Buck  HISTORY OF PRESENT ILLNESS: Today 06/28/23  HISTORY Destiny Norman is a 78 y.o. female with a history of OSA on CPAP. Returns today for follow-up.  She is that the CPAP is working well.  She denies any new issues.  She returns today for an evaluation.           REVIEW OF SYSTEMS: Out of a complete 14 system review of symptoms, the patient complains only of the following symptoms, and all other reviewed systems are negative.  FSS ESS  ALLERGIES: Allergies  Allergen Reactions   Penicillins Rash    HOME MEDICATIONS: Outpatient Medications Prior to Visit  Medication Sig Dispense Refill   baclofen  (LIORESAL ) 10 MG tablet Take 1 tablet (10 mg total) by mouth at bedtime. 30 each 0   Cholecalciferol (VITAMIN D3) 1000 UNITS CAPS Take 1,000 Units by mouth daily.     cyanocobalamin  (VITAMIN B12) 1000 MCG tablet Take 1 tablet (1,000 mcg total) by mouth daily. 90 tablet 1   famotidine  (PEPCID ) 20 MG tablet Take 1 tablet (20 mg total) by mouth at bedtime. 90 tablet 1   gabapentin  (NEURONTIN ) 100 MG capsule Take 100-200 mg in the AM 60 capsule 11   gabapentin  (NEURONTIN ) 300 MG capsule Take 1 capsule (300 mg total) by mouth at bedtime. 90 capsule 3   Iron , Ferrous Sulfate , 325 (65 Fe) MG TABS Take 325 mg by mouth every other day. 45 tablet 1   lisinopril-hydrochlorothiazide (ZESTORETIC) 10-12.5 MG tablet Take 1 tablet by mouth daily.     metoprolol succinate (TOPROL XL) 25 MG 24 hr tablet Take 25 mg by mouth daily.     omeprazole  (PRILOSEC) 20 MG capsule Take 1 capsule (20 mg total) by mouth 2 (two) times daily before a meal. 60 capsule 3   rosuvastatin  (CRESTOR ) 10 MG tablet Take 1 tablet (10 mg total) by mouth daily. 90 tablet 1   valACYclovir  (VALTREX ) 1000 MG tablet 2 g twice daily for 1 day. At the first sign of cold sore. 20 tablet 3   No  facility-administered medications prior to visit.    PAST MEDICAL HISTORY: Past Medical History:  Diagnosis Date   Breast cancer (HCC)    High cholesterol    Hypertension    Vitamin D  deficiency     PAST SURGICAL HISTORY: Past Surgical History:  Procedure Laterality Date   BIOPSY  04/27/2023   Procedure: BIOPSY;  Surgeon: Eartha Angelia Sieving, MD;  [facility];  Service: Gastroenterology;;   CESAREAN SECTION     COLONOSCOPY N/A 07/04/2014   Surgeon: Claudis RAYMOND Rivet, MD; incomplete to distal transverse colon secondary to very tortuous sigmoid colon and splenic flexure.  She completed virtual colonoscopy 08/06/2014 which did not show any polyps.   COLONOSCOPY WITH PROPOFOL  N/A 04/27/2023   Procedure: COLONOSCOPY WITH PROPOFOL ;  Surgeon: Eartha Angelia Sieving, MD;  [facility];  Service: Gastroenterology;  Laterality: N/A;  12:45 pm, asa 2   ESOPHAGEAL DILATION N/A 11/05/2020   Procedure: ESOPHAGEAL DILATION;  Surgeon: Rivet Claudis RAYMOND, MD;  [facility];  Service: Endoscopy;  Laterality: N/A;   ESOPHAGOGASTRODUODENOSCOPY (EGD) WITH PROPOFOL  N/A 11/05/2020   Surgeon: Rivet Claudis RAYMOND, MD; 2 cm hiatal hernia, otherwise normal exam.  Empiric esophageal dilation was performed.   ESOPHAGOGASTRODUODENOSCOPY (EGD) WITH PROPOFOL  N/A 04/27/2023   Procedure: ESOPHAGOGASTRODUODENOSCOPY (EGD) WITH PROPOFOL ;  Surgeon: Eartha Flavors, Toribio, MD;  [facility];  Service: Gastroenterology;  Laterality: N/A;   MASTECTOMY Right 05/24/2002   RADIOLOGY WITH ANESTHESIA N/A 09/10/2014   Procedure: MRI ABDOMIN WITH AND WITHOUT;  Surgeon: Medication Radiologist, MD;  [facility];  Service: Radiology;  Laterality: N/A;   RECONSTRUCTION BREAST W/ TRAM FLAP      FAMILY HISTORY: Family History  Problem Relation Age of Onset   Hypertension Mother    Lung cancer Mother    Prostate cancer Father    Skin cancer Sister    Melanoma Sister    Cancer  Brother    Prostate cancer Brother    Healthy Son    Sleep apnea Neg Hx     SOCIAL HISTORY: Social History   Socioeconomic History   Marital status: Married    Spouse name: Not on file   Number of children: Not on file   Years of education: Not on file   Highest education level: Not on file  Occupational History   Not on file  Tobacco Use   Smoking status: Never   Smokeless tobacco: Never  Vaping Use   Vaping status: Never Used  Substance and Sexual Activity   Alcohol use: Yes   Drug use: No   Sexual activity: Yes  Other Topics Concern   Not on file  Social History Narrative   Not on file   Social Drivers of Health   Financial Resource Strain: Low Risk  (12/31/2022)   Overall Financial Resource Strain (CARDIA)    Difficulty of Paying Living Expenses: Not hard at all  Food Insecurity: No Food Insecurity (12/31/2022)   Hunger Vital Sign    Worried About Running Out of Food in the Last Year: Never true    Ran Out of Food in the Last Year: Never true  Transportation Needs: No Transportation Needs (12/31/2022)   PRAPARE - Administrator, Civil Service (Medical): No    Lack of Transportation (Non-Medical): No  Physical Activity: Sufficiently Active (12/31/2022)   Exercise Vital Sign    Days of Exercise per Week: 7 days    Minutes of Exercise per Session: 30 min  Stress: No Stress Concern Present (12/31/2022)   [employer] of Occupational Health - Occupational Stress Questionnaire    Feeling of Stress : Not at all  Social Connections: Moderately Isolated (12/31/2022)   Social Connection and Isolation Panel [NHANES]    Frequency of Communication with Friends and Family: More than three times a week    Frequency of Social Gatherings with Friends and Family: More than three times a week    Attends Religious Services: Never    Database Administrator or Organizations: No    Attends Banker Meetings: Never    Marital Status: Married  Catering Manager  Violence: Not At Risk (12/31/2022)   Humiliation, Afraid, Rape, and Kick questionnaire    Fear of Current or Ex-Partner: No    Emotionally Abused: No    Physically Abused: No    Sexually Abused: No      PHYSICAL EXAM  There were no vitals filed for this visit. There is no height or weight on file to calculate BMI.  Generalized: Well developed, in no acute distress  Chest: Lungs clear to auscultation bilaterally  Neurological examination  Mentation: Alert oriented to time, place, history taking. Follows all commands speech and language fluent Cranial nerve II-XII: Extraocular movements were full, visual field were full on confrontational test  Head turning and shoulder shrug  were normal and symmetric. Motor: The motor testing reveals 5 over 5 strength of all 4 extremities. Good symmetric motor tone is noted throughout.  Sensory: Sensory testing is intact to soft touch on all 4 extremities. No evidence of extinction is noted.  Gait and station: Gait is normal.    DIAGNOSTIC DATA (LABS, IMAGING, TESTING) - I reviewed patient records, labs, notes, testing and imaging myself where available.  Lab Results  Component Value Date   WBC 5.1 02/07/2023   HGB 12.3 02/07/2023   HCT 38.0 02/07/2023   MCV 89 02/07/2023   PLT 288 02/07/2023      Component Value Date/Time   NA 140 04/11/2023 1206   K 4.3 04/11/2023 1206   CL 101 04/11/2023 1206   CO2 24 04/11/2023 1206   GLUCOSE 84 04/11/2023 1206   GLUCOSE 108 (H) 10/30/2020 1053   BUN 13 04/11/2023 1206   CREATININE 0.97 04/11/2023 1206   CALCIUM  9.4 04/11/2023 1206   PROT 7.0 10/14/2022 1024   ALBUMIN 4.5 10/14/2022 1024   AST 19 10/14/2022 1024   ALT 10 10/14/2022 1024   ALKPHOS 104 10/14/2022 1024   BILITOT 0.2 10/14/2022 1024   GFRNONAA >60 10/30/2020 1053   GFRAA >60 02/14/2015 0955   Lab Results  Component Value Date   CHOL 159 10/14/2022   HDL 40 10/14/2022   LDLCALC 79 10/14/2022   TRIG 242 (H) 10/14/2022    CHOLHDL 4.0 10/14/2022   Lab Results  Component Value Date   HGBA1C 6.3 (H) 10/14/2022   Lab Results  Component Value Date   VITAMINB12 255 10/22/2022   Lab Results  Component Value Date   TSH 2.630 04/13/2021      ASSESSMENT AND PLAN 78 y.o. year old female  has a past medical history of Breast cancer (HCC), High cholesterol, Hypertension, and Vitamin D  deficiency. here with:  OSA on CPAP  - CPAP compliance excellent - Good treatment of AHI  - Encourage patient to use CPAP nightly and > 4 hours each night - F/U in 1 year or sooner if needed   I spent *** minutes of face-to-face and non-face-to-face time with patient.  This included previsit chart review, lab review, study review, order entry, electronic health record documentation, patient education.  Duwaine Russell, MSN, NP-C 06/28/2023, 10:11 AM [facility] [address] [place], [place] [address] [phone]

## 2023-06-29 ENCOUNTER — Ambulatory Visit: Payer: Medicare PPO | Admitting: Adult Health

## 2023-06-30 NOTE — Telephone Encounter (Signed)
 Pt rescheduled appt for 09/23/23 at 11:00am due to NP out.

## 2023-07-04 NOTE — Progress Notes (Signed)
PATIENT: Destiny Norman DOB: Aug 03, 1945  REASON FOR VISIT: follow up HISTORY FROM: patient PRIMARY NEUROLOGIST: Dr. [name]  Chief Complaint  Patient presents with   Follow-up    Patient in room #9 and alone. Patient states she hate her CPAP but she does use it. Patient states she been having headaches every morning.      HISTORY OF PRESENT ILLNESS: Today 07/05/23:  Destiny Norman is a 78 y.o. female with a history of OSA on CPAP. Returns today for follow-up. Reports that CPAP is working well. Denies any new symptoms. CPAP report below.  Initially she saw Dr. [name] for daily headaches.  She has tried Zonegran, Topamax, nortriptyline and gabapentin.  She was also seen by Dr. Zachery Conch who did injections.  She does not feel that any of this has been helpful.  States that she has a daily dull headache.  Describes pressure behind the eyes.  Denies photophobia and phonophobia.  Denies any visual changes.  No numbness or tingling in the extremities.  Denies nausea and vomiting.  Headache states that he most of the day.  Fortunately the headache is not severe or debilitating.  Reports that over-the-counter medication has not been helpful.  She does typically take something every other day.   CT head 09/15/20:  IMPRESSION: No acute intracranial abnormality.       REVIEW OF SYSTEMS: Out of a complete 14 system review of symptoms, the patient complains only of the following symptoms, and all other reviewed systems are negative.  FSS ESS  ALLERGIES: Allergies  Allergen Reactions   Penicillins Rash    HOME MEDICATIONS: Outpatient Medications Prior to Visit  Medication Sig Dispense Refill   Cholecalciferol (VITAMIN D3) 1000 UNITS CAPS Take 1,000 Units by mouth daily.     cyanocobalamin (VITAMIN B12) 1000 MCG tablet Take 1 tablet (1,000 mcg total) by mouth daily. 90 tablet 1   famotidine (PEPCID) 20 MG tablet Take 1 tablet (20 mg total) by mouth at bedtime. 90 tablet 1    gabapentin (NEURONTIN) 100 MG capsule Take 100-200 mg in the AM 60 capsule 11   gabapentin (NEURONTIN) 300 MG capsule Take 1 capsule (300 mg total) by mouth at bedtime. 90 capsule 3   Iron, Ferrous Sulfate, 325 (65 Fe) MG TABS Take 325 mg by mouth every other day. 45 tablet 1   lisinopril-hydrochlorothiazide (ZESTORETIC) 10-12.5 MG tablet Take 1 tablet by mouth daily.     metoprolol succinate (TOPROL XL) 25 MG 24 hr tablet Take 25 mg by mouth daily.     omeprazole (PRILOSEC) 20 MG capsule Take 1 capsule (20 mg total) by mouth 2 (two) times daily before a meal. 60 capsule 3   rosuvastatin (CRESTOR) 10 MG tablet Take 1 tablet (10 mg total) by mouth daily. 90 tablet 1   valACYclovir (VALTREX) 1000 MG tablet 2 g twice daily for 1 day. At the first sign of cold sore. 20 tablet 3   baclofen (LIORESAL) 10 MG tablet Take 1 tablet (10 mg total) by mouth at bedtime. (Patient not taking: Reported on 07/05/2023) 30 each 0   No facility-administered medications prior to visit.    PAST MEDICAL HISTORY: Past Medical History:  Diagnosis Date   Breast cancer (HCC)    High cholesterol    Hypertension    Vitamin D deficiency     PAST SURGICAL HISTORY: Past Surgical History:  Procedure Laterality Date   BIOPSY  04/27/2023   Procedure: BIOPSY;  Surgeon: Dolores Frame,  MD;  [facility];  Service: Gastroenterology;;   CESAREAN SECTION     COLONOSCOPY N/A 07/04/2014   Surgeon: Malissa Hippo, MD; incomplete to distal transverse colon secondary to very tortuous sigmoid colon and splenic flexure.  She completed virtual colonoscopy 08/06/2014 which did not show any polyps.   COLONOSCOPY WITH PROPOFOL N/A 04/27/2023   Procedure: COLONOSCOPY WITH PROPOFOL;  Surgeon: Dolores Frame, MD;  [facility];  Service: Gastroenterology;  Laterality: N/A;  12:45 pm, asa 2   ESOPHAGEAL DILATION N/A 11/05/2020   Procedure: ESOPHAGEAL DILATION;  Surgeon: Malissa Hippo, MD;   [facility];  Service: Endoscopy;  Laterality: N/A;   ESOPHAGOGASTRODUODENOSCOPY (EGD) WITH PROPOFOL N/A 11/05/2020   Surgeon: Malissa Hippo, MD; 2 cm hiatal hernia, otherwise normal exam.  Empiric esophageal dilation was performed.   ESOPHAGOGASTRODUODENOSCOPY (EGD) WITH PROPOFOL N/A 04/27/2023   Procedure: ESOPHAGOGASTRODUODENOSCOPY (EGD) WITH PROPOFOL;  Surgeon: Dolores Frame, MD;  [facility];  Service: Gastroenterology;  Laterality: N/A;   MASTECTOMY Right 05/24/2002   RADIOLOGY WITH ANESTHESIA N/A 09/10/2014   Procedure: MRI ABDOMIN WITH AND WITHOUT;  Surgeon: Medication Radiologist, MD;  [facility]: [facility];  Service: Radiology;  Laterality: N/A;   RECONSTRUCTION BREAST W/ TRAM FLAP      FAMILY HISTORY: Family History  Problem Relation Age of Onset   Hypertension Mother    Lung cancer Mother    Prostate cancer Father    Skin cancer Sister    Melanoma Sister    Cancer Brother    Prostate cancer Brother    Healthy Son    Sleep apnea Neg Hx     SOCIAL HISTORY: Social History   Socioeconomic History   Marital status: Married    Spouse name: Not on file   Number of children: Not on file   Years of education: Not on file   Highest education level: Not on file  Occupational History   Not on file  Tobacco Use   Smoking status: Never   Smokeless tobacco: Never  Vaping Use   Vaping status: Never Used  Substance and Sexual Activity   Alcohol use: Yes   Drug use: No   Sexual activity: Yes  Other Topics Concern   Not on file  Social History Narrative   Not on file   Social Drivers of Health   Financial Resource Strain: Low Risk  (12/31/2022)   Overall Financial Resource Strain (CARDIA)    Difficulty of Paying Living Expenses: Not hard at all  Food Insecurity: No Food Insecurity (12/31/2022)   Hunger Vital Sign    Worried About Running Out of Food in the Last Year: Never true    Ran Out of Food in the Last Year: Never true   Transportation Needs: No Transportation Needs (12/31/2022)   PRAPARE - Administrator, Civil Service (Medical): No    Lack of Transportation (Non-Medical): No  Physical Activity: Sufficiently Active (12/31/2022)   Exercise Vital Sign    Days of Exercise per Week: 7 days    Minutes of Exercise per Session: 30 min  Stress: No Stress Concern Present (12/31/2022)   [employer] of Occupational Health - Occupational Stress Questionnaire    Feeling of Stress : Not at all  Social Connections: Moderately Isolated (12/31/2022)   Social Connection and Isolation Panel [NHANES]    Frequency of Communication with Friends and Family: More than three times a week    Frequency of Social Gatherings  with Friends and Family: More than three times a week    Attends Religious Services: Never    Active Member of Clubs or Organizations: No    Attends Banker Meetings: Never    Marital Status: Married  Catering manager Violence: Not At Risk (12/31/2022)   Humiliation, Afraid, Rape, and Kick questionnaire    Fear of Current or Ex-Partner: No    Emotionally Abused: No    Physically Abused: No    Sexually Abused: No      PHYSICAL EXAM  Vitals:   07/05/23 1027  BP: (!) 144/74  Pulse: (!) 55  Weight: 127 lb (57.6 kg)  Height: 5' (1.524 m)   Body mass index is 24.8 kg/m.  Generalized: Well developed, in no acute distress  Chest: Lungs clear to auscultation bilaterally  Neurological examination  Mentation: Alert oriented to time, place, history taking. Follows all commands speech and language fluent Cranial nerve II-XII: Extraocular movements were full, visual field were full on confrontational test Head turning and shoulder shrug  were normal and symmetric. Motor: The motor testing reveals 5 over 5 strength of all 4 extremities. Good symmetric motor tone is noted throughout.  Sensory: Sensory testing is intact to soft touch on all 4 extremities. No evidence of extinction is  noted.  Gait and station: Gait is normal.    DIAGNOSTIC DATA (LABS, IMAGING, TESTING) - I reviewed patient records, labs, notes, testing and imaging myself where available.  Lab Results  Component Value Date   WBC 5.1 02/07/2023   HGB 12.3 02/07/2023   HCT 38.0 02/07/2023   MCV 89 02/07/2023   PLT 288 02/07/2023      Component Value Date/Time   NA 140 04/11/2023 1206   K 4.3 04/11/2023 1206   CL 101 04/11/2023 1206   CO2 24 04/11/2023 1206   GLUCOSE 84 04/11/2023 1206   GLUCOSE 108 (H) 10/30/2020 1053   BUN 13 04/11/2023 1206   CREATININE 0.97 04/11/2023 1206   CALCIUM 9.4 04/11/2023 1206   PROT 7.0 10/14/2022 1024   ALBUMIN 4.5 10/14/2022 1024   AST 19 10/14/2022 1024   ALT 10 10/14/2022 1024   ALKPHOS 104 10/14/2022 1024   BILITOT 0.2 10/14/2022 1024   GFRNONAA >60 10/30/2020 1053   GFRAA >60 02/14/2015 0955   Lab Results  Component Value Date   CHOL 159 10/14/2022   HDL 40 10/14/2022   LDLCALC 79 10/14/2022   TRIG 242 (H) 10/14/2022   CHOLHDL 4.0 10/14/2022   Lab Results  Component Value Date   HGBA1C 6.3 (H) 10/14/2022   Lab Results  Component Value Date   VITAMINB12 255 10/22/2022   Lab Results  Component Value Date   TSH 2.630 04/13/2021      ASSESSMENT AND PLAN 78 y.o. year old female  has a past medical history of Breast cancer (HCC), High cholesterol, Hypertension, and Vitamin D deficiency. here with:  OSA on CPAP  - CPAP compliance excellent - Good treatment of AHI  - Encourage patient to use CPAP nightly and > 4 hours each night  2.  Daily headache  -Start Qulipta 60 mg daily.  I reviewed potential side effects and contraindications with the patient and provided her information on her AVS. -If medication is not effective we may consider MRI of the brain in the future.   - F/U in 3 months for virtual visit or sooner if needed    Butch Penny, MSN, NP-C 07/05/2023, 10:35 AM [facility] [phone]  [address], [address] [place], [place] [address] [phone]

## 2023-07-04 NOTE — Telephone Encounter (Signed)
 I called pt and appt is available 07-05-2023 at 1030 arrival 1015 for r/s MM/NP ill.  She appreciated call back.

## 2023-07-05 ENCOUNTER — Encounter: Payer: Self-pay | Admitting: Adult Health

## 2023-07-05 ENCOUNTER — Ambulatory Visit: Payer: Medicare PPO | Admitting: Adult Health

## 2023-07-05 VITALS — BP 144/74 | HR 55 | Ht 60.0 in | Wt 127.0 lb

## 2023-07-05 DIAGNOSIS — R519 Headache, unspecified: Secondary | ICD-10-CM

## 2023-07-05 DIAGNOSIS — G4733 Obstructive sleep apnea (adult) (pediatric): Secondary | ICD-10-CM

## 2023-07-05 DIAGNOSIS — G43E19 Chronic migraine with aura, intractable, without status migrainosus: Secondary | ICD-10-CM

## 2023-07-05 MED ORDER — QULIPTA 60 MG PO TABS: 60.0000 mg | ORAL_TABLET | Freq: Every day | ORAL | 11 refills | Status: DC

## 2023-07-05 NOTE — Patient Instructions (Addendum)
Your Plan:  Start Qulipta 60 mg tablet daily  Avoid over the counter medicine In the future can do MRI brain if headaches continue If your symptoms worsen or you develop new symptoms please let us know.    Thank you for coming to see [place] at [facility]. I hope we have been able to provide you high quality care today.  You may receive a patient satisfaction survey over the next few weeks. We would appreciate your feedback and comments so that we may continue to improve ourselves and the health of our patients.

## 2023-07-08 ENCOUNTER — Telehealth: Payer: Self-pay | Admitting: Adult Health

## 2023-07-08 ENCOUNTER — Other Ambulatory Visit: Payer: Self-pay | Admitting: Family Medicine

## 2023-07-08 NOTE — Telephone Encounter (Signed)
Pt spouse is asking for Qulipta samples, please call to discuss.

## 2023-07-11 ENCOUNTER — Other Ambulatory Visit: Payer: Self-pay | Admitting: Family Medicine

## 2023-07-11 NOTE — Telephone Encounter (Signed)
This has already been prescribed. Dose patient need a PA?

## 2023-07-12 ENCOUNTER — Other Ambulatory Visit (HOSPITAL_COMMUNITY): Payer: Self-pay

## 2023-07-12 ENCOUNTER — Telehealth: Payer: Self-pay

## 2023-07-12 NOTE — Telephone Encounter (Signed)
PA request has been Submitted. New Encounter created for follow up. For additional info see Pharmacy Prior Auth telephone encounter from 07/12/2023.

## 2023-07-12 NOTE — Telephone Encounter (Signed)
Pharmacy Patient Advocate Encounter   Received notification from Physician's Office that prior authorization for Qulipta 60MG  tablets is required/requested.   Insurance verification completed.   The patient is insured through [place] .   Per test claim: PA required; PA submitted to above mentioned insurance via CoverMyMeds Key/confirmation #/EOC [id number] Status is pending

## 2023-07-12 NOTE — Telephone Encounter (Signed)
I called [facility] and a PA is needed,  pharmacist said that PA Key # started on [facility] [id number].  Will send to pa team

## 2023-07-13 NOTE — Telephone Encounter (Signed)
Denial letter has been uploaded under media tab-

## 2023-07-13 NOTE — Telephone Encounter (Signed)
Pharmacy Patient Advocate Encounter  Received notification from [facility] that Prior Authorization for [facility] has been DENIED.  Full denial letter will be uploaded to the media tab. See denial reason below.   PA #/Case ID/Reference #: You asked for the drug above for your Headache, unspecified. This is an off-label use that is not medically accepted. The Medicare rule in the Prescription Drug Benefit Manual (Chapter 6, Section 10.6) says a drug must be used for a medically accepted indication (covered use). Off-label use is medically accepted when there is proof in one or more of the drug guides that the drug works for your condition. We look at the two major drug guides (compendia): the DRUGDEX Information System and the [facility] Formulary Service Drug Information (AHFS-DI). [employer] has decided that there is no proof in either drug guide that this drug works for your condition. Per Medicare rules, it is not covered.  *e-appeal available for this denial

## 2023-07-13 NOTE — Addendum Note (Signed)
Addended by: Enedina Finner on: 07/13/2023 11:46 AM   Modules accepted: Level of Service

## 2023-07-13 NOTE — Telephone Encounter (Signed)
Patient's husband, Khloee Garza can you find out why she has been denied. Can you give her sample of the medication to bridge the gap until approved? Would like a call back.

## 2023-07-13 NOTE — Telephone Encounter (Signed)
I called and spoke to pt and husband.  Due to diagnosis (daily headaches)  qulipta was denied.  I relayed would touch base with Megan NP and see if she would change diagnosis to coer the quilipta, and samples since remote today not able to verify if we have any.  He appreciated callback.  Would see when back in office.

## 2023-07-14 NOTE — Telephone Encounter (Signed)
We could try an injectable if the patient is amenable.  We can see if her insurance will approve that.  The way she described her headaches during the office visit was that they were daily.  Denied photophobia phonophobia nausea and vomiting.  Clinically does not present as a typical migraine.  However you can asked the patient again-  Are the headaches unilateral?  Pulsating?  Moderate to severe intensity? Nausea vomiting? Light or noise sensitivity? Visual changes?

## 2023-07-15 NOTE — Progress Notes (Signed)
See telephone note. Destiny Norman was denied. Nurse reached out to clarify her symptoms. She is now stating  headache is not pulsating but Moderate intensity. Begin shortly after I get up in the morning. Loud noises and flashing lights bother her.  I do have an aura (like a flickering i n my peripheral vision) but that's not as often.   I will add diagnosis of migraine. We will see if insurance approves qulipta.   In the future my consider ONO since she wakes with the headache. CPAP compliance is good.

## 2023-07-19 ENCOUNTER — Telehealth: Payer: Self-pay | Admitting: Pharmacist

## 2023-07-19 NOTE — Telephone Encounter (Signed)
 I have updated the patient

## 2023-07-19 NOTE — Telephone Encounter (Signed)
 Appeal has been submitted for Qulipta. Will advise when response is received, please be advised that most companies may take 30 days to make a decision. Appeal letter and supporting documentation have been submitted via CMM E-Appeal.  Thank you, Dellie Burns, PharmD Clinical Pharmacist  [place]  Direct Dial: [phone]

## 2023-07-26 NOTE — Telephone Encounter (Signed)
 Appeal has been approved per [facility]:

## 2023-08-21 ENCOUNTER — Telehealth: Payer: Self-pay | Admitting: Adult Health

## 2023-08-21 NOTE — Telephone Encounter (Signed)
 I called the patient.  She does feel that Bennie Pierini may have helped with the severity of her headaches.  She does typically have a daily headache.  Feels like a pressure sensation.  These headaches are not debilitating nor associated with any additional symptoms.  She had a CT head in 2022 that was unremarkable.  She is never had an MRI of the brain.  She is also on gabapentin but does not take it consistently.  Reports that she tolerates it well with no side effects.  For the next 30 days she will continue on Qulipta 60 mg daily and she will take gabapentin 300 mg at bedtime consistently.  She will see how her headaches respond.  She is encouraged to keep a headache journal.   She does report in the past she has had headache with aura but no visual changes with the current headaches.  Did caution the patient about using estrogen with a history of migraine with aura.  We also discussed MRI of the brain however she would like to defer and try medication for the next 30 days before proceeding.

## 2023-08-24 NOTE — Telephone Encounter (Signed)
LMVM for pt that returned call.  °

## 2023-08-24 NOTE — Telephone Encounter (Signed)
 Returned phone call, would like a call back

## 2023-08-24 NOTE — Telephone Encounter (Signed)
 I encouraged her to follow the directions outlined in previous telephone note regarding Qulipta and gabapentin.  In her headache journal she can outline on a scale 1-10 how bad the pain is, any associated symptoms, any potential triggers such as food, sleep, stress etc.

## 2023-08-24 NOTE — Telephone Encounter (Signed)
 I called pt.  She was at [employer] for meeting, asked for me to call her back.  She has been on the qulipta for about a month.

## 2023-08-24 NOTE — Telephone Encounter (Signed)
 Pt called back.  She said that she has been on qulipta for about 30 days. She thinks it is working for her pressure headaches.  She did make mention of having the last 2 days of several seconds of when sitting,  of hard time taking a deep breath, she has not been walking the last 2 days due to pollen.   I told her did not think it was qulipta since she had been on for over 25 days and just happened last 2 days (short episode).  She has been taking the qulipta  am and gabapentin at night (helps her sleep).   She does not want to stop taking but questioned taking acutely.  I relayed that that qulipta is preventative daily medication, not a acute medication.  I told her I will send message,  she said that [facility] NP did not have to respond, unless she wanted to.

## 2023-08-29 ENCOUNTER — Other Ambulatory Visit: Payer: Self-pay | Admitting: Family Medicine

## 2023-09-13 ENCOUNTER — Encounter: Payer: Self-pay | Admitting: Adult Health

## 2023-09-13 NOTE — Telephone Encounter (Signed)
 Since she is waking up with headaches may we try doing an overnight pulse oximetry to see if her oxygen drops despite being on CPAP.  She would need to wear the Ono while on CPAP

## 2023-09-13 NOTE — Telephone Encounter (Signed)
 She had ono on cpap back 08-2022 was ok.

## 2023-09-15 NOTE — Telephone Encounter (Signed)
 Sandy RN- responded to patient

## 2023-09-19 ENCOUNTER — Ambulatory Visit: Admitting: Family Medicine

## 2023-09-19 VITALS — BP 162/69 | HR 51 | Temp 97.5°F | Ht 60.0 in | Wt 131.4 lb

## 2023-09-19 DIAGNOSIS — I10 Essential (primary) hypertension: Secondary | ICD-10-CM | POA: Diagnosis not present

## 2023-09-19 DIAGNOSIS — E782 Mixed hyperlipidemia: Secondary | ICD-10-CM

## 2023-09-19 DIAGNOSIS — E538 Deficiency of other specified B group vitamins: Secondary | ICD-10-CM | POA: Insufficient documentation

## 2023-09-19 DIAGNOSIS — R519 Headache, unspecified: Secondary | ICD-10-CM | POA: Diagnosis not present

## 2023-09-19 DIAGNOSIS — D509 Iron deficiency anemia, unspecified: Secondary | ICD-10-CM | POA: Diagnosis not present

## 2023-09-19 DIAGNOSIS — R7303 Prediabetes: Secondary | ICD-10-CM

## 2023-09-19 NOTE — Assessment & Plan Note (Signed)
 Continue Ellipta.  Follow-up with neurology.

## 2023-09-19 NOTE — Patient Instructions (Addendum)
 Labs today.  Monitor BP at home. If persistently elevated > 140/90, please let me know.  Follow up with neurology.  Follow up with me in 3 months.

## 2023-09-19 NOTE — Assessment & Plan Note (Signed)
 BP elevated on repeat today.  Patient asymptomatic.  Advised to monitor blood pressures at home and inform me if blood pressure consistently elevated greater than 140/90.  Continue current medications.

## 2023-09-19 NOTE — Assessment & Plan Note (Signed)
Has been stable on Crestor.  Continue.  Lipid panel today.

## 2023-09-19 NOTE — Progress Notes (Signed)
 Subjective:  Patient ID: Destiny Norman, female    DOB: [birth year]  Age: 78 y.o. MRN: [id number]  CC: Follow-up   HPI:  78 year old female presents for follow-up.  Has seen neurology.  Continues to have frequent headaches.  Primarily located in the occipital region.  Is currently on Qulipta .  Patient has a history of anemia and B12 deficiency.  She is concerned about this.  Needs labs today.  Patient's BP elevated here today.  She is on lisinopril/HCTZ and metoprolol.  Needs reassessment of lipids.  On Crestor .   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/18/2023   Tortuous colon 11/08/2022   Iron  deficiency anemia 11/08/2022   Occipital headache 10/14/2022   OSA on CPAP 10/14/2022   Essential hypertension 12/23/2020   Mixed hyperlipidemia 12/23/2020   Prediabetes 12/23/2020    Social Hx   Social History   Socioeconomic History   Marital status: Married    Spouse name: Not on file   Number of children: Not on file   Years of education: Not on file   Highest education level: Not on file  Occupational History   Not on file  Tobacco Use   Smoking status: Never   Smokeless tobacco: Never  Vaping Use   Vaping status: Never Used  Substance and Sexual Activity   Alcohol use: Yes   Drug use: No   Sexual activity: Yes  Other Topics Concern   Not on file  Social History Narrative   Not on file   Social Drivers of Health   Financial Resource Strain: Low Risk  (12/31/2022)   Overall Financial Resource Strain (CARDIA)    Difficulty of Paying Living Expenses: Not hard at all  Food Insecurity: No Food Insecurity (12/31/2022)   Hunger Vital Sign    Worried About Running Out of Food in the Last Year: Never true    Ran Out of Food in the Last Year: Never true  Transportation Needs: No Transportation Needs (12/31/2022)   PRAPARE - Administrator, Civil Service (Medical): No    Lack of Transportation  (Non-Medical): No  Physical Activity: Sufficiently Active (12/31/2022)   Exercise Vital Sign    Days of Exercise per Week: 7 days    Minutes of Exercise per Session: 30 min  Stress: No Stress Concern Present (12/31/2022)   [employer] of Occupational Health - Occupational Stress Questionnaire    Feeling of Stress : Not at all  Social Connections: Moderately Isolated (12/31/2022)   Social Connection and Isolation Panel [NHANES]    Frequency of Communication with Friends and Family: More than three times a week    Frequency of Social Gatherings with Friends and Family: More than three times a week    Attends Religious Services: Never    Database administrator or Organizations: No    Attends Engineer, structural: Never    Marital Status: Married    Review of Systems Per HPI  Objective:  BP (!) 162/69   Pulse (!) 51   Temp (!) 97.5 F (36.4 C)   Ht 5' (1.524 m)   Wt 131 lb 6.4 oz (59.6 kg)   SpO2 100%   BMI 25.66 kg/m      09/19/2023   11:05 AM 09/19/2023   10:22 AM 07/05/2023   10:27 AM  BP/Weight  Systolic BP 162 171 144  Diastolic BP 69 81 74  Wt. (Lbs)  131.4 127  BMI  25.66 kg/m2 24.8 kg/m2    Physical Exam Vitals and nursing note reviewed.  Constitutional:      General: She is not in acute distress.    Appearance: Normal appearance.  HENT:     Head: Normocephalic and atraumatic.  Cardiovascular:     Rate and Rhythm: Regular rhythm. Bradycardia present.  Pulmonary:     Effort: Pulmonary effort is normal.     Breath sounds: Normal breath sounds. No wheezing or rales.  Neurological:     General: No focal deficit present.     Mental Status: She is alert.  Psychiatric:        Mood and Affect: Mood normal.        Behavior: Behavior normal.     Lab Results  Component Value Date   WBC 5.1 02/07/2023   HGB 12.3 02/07/2023   HCT 38.0 02/07/2023   PLT 288 02/07/2023   GLUCOSE 84 04/11/2023   CHOL 159 10/14/2022   TRIG 242 (H) 10/14/2022   HDL 40  10/14/2022   LDLCALC 79 10/14/2022   ALT 10 10/14/2022   AST 19 10/14/2022   NA 140 04/11/2023   K 4.3 04/11/2023   CL 101 04/11/2023   CREATININE 0.97 04/11/2023   BUN 13 04/11/2023   CO2 24 04/11/2023   TSH 2.630 04/13/2021   HGBA1C 6.3 (H) 10/14/2022     Assessment & Plan:  Iron  deficiency anemia, unspecified iron  deficiency anemia type Assessment & Plan: Reassessing today with labs.  Orders: -     CBC -     Iron , TIBC and Ferritin Panel  Prediabetes -     Hemoglobin A1c -     Microalbumin / creatinine urine ratio  Mixed hyperlipidemia Assessment & Plan: Has been stable on Crestor .  Continue.  Lipid panel today.  Orders: -     Lipid panel  Essential hypertension Assessment & Plan: BP elevated on repeat today.  Patient asymptomatic.  Advised to monitor blood pressures at home and inform me if blood pressure consistently elevated greater than 140/90.  Continue current medications.  Orders: -     Microalbumin / creatinine urine ratio  B12 deficiency Assessment & Plan: B12 today.  Continue current supplementation  Orders: -     Vitamin B12  Occipital headache Assessment & Plan: Continue Ellipta.  Follow-up with neurology.     Follow-up:  3 months  Welma Mccombs Debrah Fan DO [facility]

## 2023-09-19 NOTE — Assessment & Plan Note (Signed)
Reassessing today with labs.

## 2023-09-19 NOTE — Assessment & Plan Note (Signed)
 B12 today.  Continue current supplementation

## 2023-09-21 ENCOUNTER — Encounter: Payer: Self-pay | Admitting: Family Medicine

## 2023-09-21 LAB — CBC
Hematocrit: 38.5 % (ref 34.0–46.6)
Hemoglobin: 13.1 g/dL (ref 11.1–15.9)
MCH: 32 pg (ref 26.6–33.0)
MCHC: 34 g/dL (ref 31.5–35.7)
MCV: 94 fL (ref 79–97)
Platelets: 253 10*3/uL (ref 150–450)
RBC: 4.1 x10E6/uL (ref 3.77–5.28)
RDW: 12 % (ref 11.7–15.4)
WBC: 7.2 10*3/uL (ref 3.4–10.8)

## 2023-09-21 LAB — MICROALBUMIN / CREATININE URINE RATIO
Creatinine, Urine: 34.2 mg/dL
Microalbumin, Urine: <3 ug/mL

## 2023-09-21 LAB — HEMOGLOBIN A1C
Est. average glucose Bld gHb Est-mCnc: 114 mg/dL
Hgb A1c MFr Bld: 5.6 % (ref 4.8–5.6)

## 2023-09-21 LAB — LIPID PANEL
Chol/HDL Ratio: 4.1 ratio (ref 0.0–4.4)
Cholesterol, Total: 209 mg/dL — ABNORMAL HIGH (ref 100–199)
HDL: 51 mg/dL (ref >39)
LDL Chol Calc (NIH): 136 mg/dL — ABNORMAL HIGH (ref 0–99)
Triglycerides: 125 mg/dL (ref 0–149)
VLDL Cholesterol Cal: 22 mg/dL (ref 5–40)

## 2023-09-21 LAB — IRON,TIBC AND FERRITIN PANEL
Ferritin: 52 ng/mL (ref 15–150)
Iron Saturation: 27 % (ref 15–55)
Iron: 92 ug/dL (ref 27–139)
Total Iron Binding Capacity: 340 ug/dL (ref 250–450)
UIBC: 248 ug/dL (ref 118–369)

## 2023-09-21 LAB — VITAMIN B12: Vitamin B-12: 1244 pg/mL (ref 232–1245)

## 2023-09-23 ENCOUNTER — Ambulatory Visit: Payer: Medicare PPO | Admitting: Adult Health

## 2023-10-10 ENCOUNTER — Other Ambulatory Visit: Payer: Self-pay | Admitting: Family Medicine

## 2023-10-13 ENCOUNTER — Telehealth: Payer: Medicare PPO | Admitting: Adult Health

## 2023-10-20 ENCOUNTER — Other Ambulatory Visit (INDEPENDENT_AMBULATORY_CARE_PROVIDER_SITE_OTHER): Payer: Self-pay | Admitting: Gastroenterology

## 2023-10-25 ENCOUNTER — Telehealth: Admitting: Adult Health

## 2023-10-30 ENCOUNTER — Other Ambulatory Visit: Payer: Self-pay | Admitting: Gastroenterology

## 2023-10-30 DIAGNOSIS — K219 Gastro-esophageal reflux disease without esophagitis: Secondary | ICD-10-CM

## 2023-11-08 ENCOUNTER — Encounter: Payer: Self-pay | Admitting: Neurology

## 2023-11-08 ENCOUNTER — Ambulatory Visit: Payer: Self-pay | Admitting: Neurology

## 2023-11-08 VITALS — BP 150/81 | HR 56 | Resp 15 | Ht 60.0 in

## 2023-11-08 DIAGNOSIS — G43E19 Chronic migraine with aura, intractable, without status migrainosus: Secondary | ICD-10-CM | POA: Insufficient documentation

## 2023-11-08 DIAGNOSIS — R4189 Other symptoms and signs involving cognitive functions and awareness: Secondary | ICD-10-CM | POA: Insufficient documentation

## 2023-11-08 DIAGNOSIS — G4733 Obstructive sleep apnea (adult) (pediatric): Secondary | ICD-10-CM | POA: Diagnosis not present

## 2023-11-08 DIAGNOSIS — R519 Headache, unspecified: Secondary | ICD-10-CM | POA: Diagnosis not present

## 2023-11-08 MED ORDER — NORTRIPTYLINE HCL 10 MG PO CAPS: 20.0000 mg | ORAL_CAPSULE | Freq: Every day | ORAL | 11 refills | Status: DC

## 2023-11-08 NOTE — Progress Notes (Signed)
 Chief Complaint  Patient presents with   Headache    Rm15, alone, persistent headaches: daily, varying intensity. Trigger: unidentifiable      ASSESSMENT AND PLAN  Destiny Norman is a 78 y.o. female  Chronic headache Obstructive sleep apnea  Advised her to stop frequent over-the-counter medications, to avoid medication rebound headache,  Add on nortriptyline  10 mg titrating to 20 mg every night as preventive medications,  Tried Qulipta  3 months without helping her, worry about the high co-pay, stopped it Cognitive impairment  Sister at age 71 suffered dementia  Her MOCA 21/30  She would like Alzheimer specific profile,ATN and APOE genotying   DIAGNOSTIC DATA (LABS, IMAGING, TESTING) - I reviewed patient records, labs, notes, testing and imaging myself where available.   MEDICAL HISTORY:  Destiny Norman, is a 78 year old female seen in request by primary care  Family Medicine Dr. Debrah Fan, Jayce for persistent headache, memory loss,  History is obtained from the patient and review of electronic medical records. I personally reviewed pertinent available imaging films in PACS.   PMHx of  HTN HLD GERD Hx of left breast cancer, s/p lobectomy, chemo  I saw her since November 2022 for complaints of frequent headaches, often starting from occipital neck region, pressure, sometimes with associated blood pressure elevation  Over the past couple years, we have done CT head without contrast in April 2025 no acute abnormality  MRI cervical on Apr 22 2021, at [facility]:  Mild mid cervical degenerative disc disease with disc osteophyte most significant at C5-6 causing mild canal stenosis and moderate bilateral foraminal narrowing.   Normal ESR C-reactive protein  She was diagnosed with obstructive sleep apnea, compliant with her CPAP  She still complains almost daily headache, started shortly after she gets up, no limitation on her activity, but very  uncomfortable, tried Qulipta  daily since February without help, she also take frequent over-the-counter medications  On further questioning, she complains of difficulty sleeping, anxious, sister at age 32 was diagnosed with Alzheimer's disease, herself noticed gradual onset of memory loss over the past couple years, used to live driving, no longer feel confident on highway, has not visited [place], she is a retired Solicitor, [facility]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    PHYSICAL EXAM:   Vitals:   11/08/23 1453 11/08/23 1459 11/08/23 1500  BP: (!) 166/80 (!) 169/65 (!) 150/81  Pulse: 60 (!) 56   Resp: 17 15   SpO2: 97% 98%   Height: 5' (1.524 m)       Body mass index is 25.66 kg/m.  PHYSICAL EXAMNIATION:  Gen: NAD, conversant, well nourised, well groomed                     Cardiovascular: Regular rate rhythm, no peripheral edema, warm, nontender. Eyes: Conjunctivae clear without exudates or hemorrhage Neck: Supple, no carotid bruits. Pulmonary: Clear to auscultation bilaterally   NEUROLOGICAL EXAM:  MENTAL STATUS: Speech/cognition: Anxious looking elderly female, awake, alert, oriented to history taking and casual conversation    11/08/2023    3:00 PM  Montreal Cognitive Assessment   Visuospatial/ Executive (0/5) 4  Naming (0/3) 3  Attention: Read list of digits (0/2) 1  Attention: Read list of letters (0/1) 1  Attention: Serial 7 subtraction starting at 100 (0/3) 1  Language: Repeat phrase (0/2) 2  Language : Fluency (0/1) 1  Abstraction (0/2) 2  Delayed Recall (0/5) 0  Orientation (0/6) 6  Total 21    CRANIAL  NERVES: CN II: Visual fields are full to confrontation. Pupils are round equal and briskly reactive to light. CN III, IV, VI: extraocular movement are normal. No ptosis. CN V: Facial sensation is intact to light touch CN VII: Face is symmetric with normal eye closure  CN VIII: Hearing is normal to causal conversation. CN IX, X: Phonation is normal. CN XI:  Head turning and shoulder shrug are intact  MOTOR: There is no pronator drift of out-stretched arms. Muscle bulk and tone are normal. Muscle strength is normal.  REFLEXES: Reflexes are 2+ and symmetric at the biceps, triceps, knees, and ankles. Plantar responses are flexor.  SENSORY: Intact to light touch, pinprick and vibratory sensation are intact in fingers and toes.  COORDINATION: There is no trunk or limb dysmetria noted.  GAIT/STANCE: Posture is normal. Gait is steady    REVIEW OF SYSTEMS:  Full 14 system review of systems performed and notable only for as above All other review of systems were negative.   ALLERGIES: Allergies  Allergen Reactions   Penicillin G     Other Reaction(s): Not available  Penicillin   Penicillins Rash    Other Reaction(s): Not available    HOME MEDICATIONS: Current Outpatient Medications  Medication Sig Dispense Refill   Atogepant  (QULIPTA ) 60 MG TABS Take 1 tablet (60 mg total) by mouth daily. 30 tablet 11   Cholecalciferol (VITAMIN D3) 1000 UNITS CAPS Take 1,000 Units by mouth daily.     cyanocobalamin  (VITAMIN B12) 1000 MCG tablet Take 1 tablet (1,000 mcg total) by mouth daily. 90 tablet 1   famotidine  (PEPCID ) 20 MG tablet Take 1 tablet (20 mg total) by mouth at bedtime. 90 tablet 1   gabapentin  (NEURONTIN ) 300 MG capsule Take 1 capsule (300 mg total) by mouth at bedtime. 90 capsule 3   Iron , Ferrous Sulfate , 325 (65 Fe) MG TABS Take 325 mg by mouth every other day. 45 tablet 1   lisinopril-hydrochlorothiazide (ZESTORETIC) 10-12.5 MG tablet TAKE (1) TABLET BY MOUTH ONCE DAILY. 90 tablet 3   metoprolol succinate (TOPROL-XL) 25 MG 24 hr tablet TAKE (1) TABLET BY MOUTH ONCE DAILY. 90 tablet 3   omeprazole  (PRILOSEC) 20 MG capsule Take 1 capsule (20 mg total) by mouth 2 (two) times daily before a meal. 60 capsule 3   rosuvastatin  (CRESTOR ) 10 MG tablet Take 1 tablet (10 mg total) by mouth daily. 90 tablet 3   valACYclovir  (VALTREX ) 1000  MG tablet 2 g twice daily for 1 day. At the first sign of cold sore. 20 tablet 3   No current facility-administered medications for this visit.    PAST MEDICAL HISTORY: Past Medical History:  Diagnosis Date   Breast cancer (HCC)    High cholesterol    Hypertension    Vitamin D  deficiency     PAST SURGICAL HISTORY: Past Surgical History:  Procedure Laterality Date   BIOPSY  04/27/2023   Procedure: BIOPSY;  Surgeon: Urban Garden, MD;  [facility]: [facility];  Service: Gastroenterology;;   CESAREAN SECTION     COLONOSCOPY N/A 07/04/2014   Surgeon: Ruby Corporal, MD; incomplete to distal transverse colon secondary to very tortuous sigmoid colon and splenic flexure.  She completed virtual colonoscopy 08/06/2014 which did not show any polyps.   COLONOSCOPY WITH PROPOFOL  N/A 04/27/2023   Procedure: COLONOSCOPY WITH PROPOFOL ;  Surgeon: Urban Garden, MD;  Location: [facility];  Service: Gastroenterology;  Laterality: N/A;  12:45 pm, asa 2   ESOPHAGEAL DILATION N/A 11/05/2020  Procedure: ESOPHAGEAL DILATION;  Surgeon: Ruby Corporal, MD;  Location: [facility];  Service: Endoscopy;  Laterality: N/A;   ESOPHAGOGASTRODUODENOSCOPY (EGD) WITH PROPOFOL  N/A 11/05/2020   Surgeon: Ruby Corporal, MD; 2 cm hiatal hernia, otherwise normal exam.  Empiric esophageal dilation was performed.   ESOPHAGOGASTRODUODENOSCOPY (EGD) WITH PROPOFOL  N/A 04/27/2023   Procedure: ESOPHAGOGASTRODUODENOSCOPY (EGD) WITH PROPOFOL ;  Surgeon: Urban Garden, MD;  Location: [facility];  Service: Gastroenterology;  Laterality: N/A;   MASTECTOMY Right 05/24/2002   RADIOLOGY WITH ANESTHESIA N/A 09/10/2014   Procedure: MRI ABDOMIN WITH AND WITHOUT;  Surgeon: Medication Radiologist, MD;  Location: [facility];  Service: Radiology;  Laterality: N/A;   RECONSTRUCTION BREAST W/ TRAM FLAP      FAMILY HISTORY: Family History  Problem Relation Age of Onset   Hypertension Mother    Lung  cancer Mother    Prostate cancer Father    Skin cancer Sister    Melanoma Sister    Cancer Brother    Prostate cancer Brother    Healthy Son    Sleep apnea Neg Hx     SOCIAL HISTORY: Social History   Socioeconomic History   Marital status: Married    Spouse name: Not on file   Number of children: Not on file   Years of education: Not on file   Highest education level: Not on file  Occupational History   Not on file  Tobacco Use   Smoking status: Never   Smokeless tobacco: Never  Vaping Use   Vaping status: Never Used  Substance and Sexual Activity   Alcohol use: Yes   Drug use: No   Sexual activity: Yes  Other Topics Concern   Not on file  Social History Narrative   Not on file   Social Drivers of Health   Financial Resource Strain: Low Risk  (12/31/2022)   Overall Financial Resource Strain (CARDIA)    Difficulty of Paying Living Expenses: Not hard at all  Food Insecurity: No Food Insecurity (12/31/2022)   Hunger Vital Sign    Worried About Running Out of Food in the Last Year: Never true    Ran Out of Food in the Last Year: Never true  Transportation Needs: No Transportation Needs (12/31/2022)   PRAPARE - Administrator, Civil Service (Medical): No    Lack of Transportation (Non-Medical): No  Physical Activity: Sufficiently Active (12/31/2022)   Exercise Vital Sign    Days of Exercise per Week: 7 days    Minutes of Exercise per Session: 30 min  Stress: No Stress Concern Present (12/31/2022)   [employer] of Occupational Health - Occupational Stress Questionnaire    Feeling of Stress : Not at all  Social Connections: Moderately Isolated (12/31/2022)   Social Connection and Isolation Panel    Frequency of Communication with Friends and Family: More than three times a week    Frequency of Social Gatherings with Friends and Family: More than three times a week    Attends Religious Services: Never    Database administrator or Organizations: No    Attends  Banker Meetings: Never    Marital Status: Married  Catering manager Violence: Not At Risk (12/31/2022)   Humiliation, Afraid, Rape, and Kick questionnaire    Fear of Current or Ex-Partner: No    Emotionally Abused: No    Physically Abused: No    Sexually Abused: No      Phebe Brasil, M.D. Ph.D.  [facility] [address] [place], [place] [address] Ph: [phone] Fax: [phone]  CC:  Cook, Jayce G, DO [address] [place],  [place] [address]  Cook, Jayce G, DO

## 2023-11-11 LAB — APOE ALZHEIMER'S RISK

## 2023-11-11 LAB — ATN PROFILE
Beta-amyloid 40: 291.61 pg/mL
N -- NfL, Plasma: 9.78 pg/mL — ABNORMAL HIGH (ref 0.00–6.04)

## 2023-11-14 ENCOUNTER — Telehealth: Payer: Self-pay | Admitting: Neurology

## 2023-11-14 NOTE — Telephone Encounter (Signed)
 Patient's husband, Dempsey Spiller (on last [facility] DPR) calling about concerns with results from blood work reviewed on MyChart.

## 2023-11-17 ENCOUNTER — Telehealth: Payer: Self-pay | Admitting: Adult Health

## 2023-11-17 NOTE — Telephone Encounter (Signed)
 I received an email from the patient's son request that some I called him about results.  Dr. Ross ordered lab results but she is out of the office today.  I did call the patient and she put her husband on the phone.  I did review the ATN profile results.  I did advise that Dr. Onita still had blood work pending.  Pending that blood work Dr. Onita will discuss with them next steps.

## 2023-11-18 LAB — ATN PROFILE
A -- Beta-amyloid 42/40 Ratio: 0.095 — ABNORMAL LOW (ref >0.102)
Beta-amyloid 42: 27.64 pg/mL
T -- p-tau181: 2.14 pg/mL — ABNORMAL HIGH (ref 0.00–0.97)

## 2023-11-18 LAB — APOE ALZHEIMER'S RISK

## 2023-11-22 ENCOUNTER — Ambulatory Visit: Payer: Self-pay | Admitting: Neurology

## 2023-11-24 ENCOUNTER — Ambulatory Visit: Admitting: Neurology

## 2023-11-24 ENCOUNTER — Encounter: Payer: Self-pay | Admitting: Neurology

## 2023-11-24 VITALS — BP 134/82 | Ht 60.0 in | Wt 132.0 lb

## 2023-11-24 DIAGNOSIS — R519 Headache, unspecified: Secondary | ICD-10-CM

## 2023-11-24 DIAGNOSIS — G4733 Obstructive sleep apnea (adult) (pediatric): Secondary | ICD-10-CM | POA: Diagnosis not present

## 2023-11-24 DIAGNOSIS — R4189 Other symptoms and signs involving cognitive functions and awareness: Secondary | ICD-10-CM | POA: Diagnosis not present

## 2023-11-24 MED ORDER — MEMANTINE HCL 10 MG PO TABS: 10.0000 mg | ORAL_TABLET | Freq: Two times a day (BID) | ORAL | 11 refills | Status: AC

## 2023-11-24 NOTE — Progress Notes (Signed)
 --   Chief Complaint  Patient presents with   Follow-up    Rm 14, with son and husband, discuss results and treatment plan      ASSESSMENT AND PLAN  Destiny Norman is a 78 y.o. female  Cognitive impairment  Sister at age 29 suffered dementia  Her MOCA 21/30, missed 5 out of 5 recall  Positive ATN, homozygous for APOE4  Laboratory evaluation showed no treatable etiology  Above findings are most consistent with early Alzheimer's disease,  Discussed with patient about further imaging evaluation such as MRI of the brain, amyloid PET scan, decided to hold off, worry about side effect with amyloid removing agent treatment, especially in light of her homozygous APOE4 status  Start Namenda 10 mg twice a day  Advised her moderate exercise, optimize control of obstructive sleep apnea,  Nortriptyline  has helped her sleep, will keep low-dose 20 mg every night, may consider stop gabapentin , encourage her not to overuse over-the-counter medications for her complaints of constant daily headaches, previously tried gabapentin  during the day, Qulipta  without helping her headache We also talk about potential benefit of GLP-1 treatment, she does have mild elevated BMI of 25.8, previous mildly elevated A1c, will discuss with her primary care for treatment option RTC in 6 months   DIAGNOSTIC DATA (LABS, IMAGING, TESTING) - I reviewed patient records, labs, notes, testing and imaging myself where available.   MEDICAL HISTORY:  Destiny Norman, is a 78 year old female seen in request by primary care [facility] Dr. Bluford, Jayce for persistent headache, memory loss,  History is obtained from the patient and review of electronic medical records. I personally reviewed pertinent available imaging films in PACS.   PMHx of  HTN HLD GERD Hx of left breast cancer, s/p lobectomy, chemo  I saw her since November 2022 for complaints of frequent headaches, often starting from  occipital neck region, pressure, sometimes with associated blood pressure elevation  Over the past couple years, we have done CT head without contrast in April 2025 no acute abnormality  MRI cervical on Apr 22 2021, at [facility]:  Mild mid cervical degenerative disc disease with disc osteophyte most significant at C5-6 causing mild canal stenosis and moderate bilateral foraminal narrowing.   Normal ESR C-reactive protein  She was diagnosed with obstructive sleep apnea, compliant with her CPAP  She still complains almost daily headache, started shortly after she gets up, no limitation on her activity, but very uncomfortable, tried Qulipta  daily since February without help, she also take frequent over-the-counter medications  On further questioning, she complains of difficulty sleeping, anxious, sister at age 74 was diagnosed with Alzheimer's disease, herself noticed gradual onset of memory loss over the past couple years, used to live driving, no longer feel confident on highway, has not [place], she is a retired Solicitor, [place]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   UPDATE November 24 2023: She is accompanied by her son and husband to go over workup result for her cognitive impairment  November 08, 2023 lab evaluation, 2 copies of APOE4, decreased beta amyloid 42/40 ratio, increased [id number], increased NFL,  this results are consistent with the late onset Alzheimer's disease, she does have strong family history of dementia, sister at age 22 was diagnosed with dementia  CT head showed age-appropriate change in April 2022,  She remains physically active, walking 10,000 steps each day, has good social support,  But she is very bothered by her daily headache over the past couple years, constant,  failed to improve by treatment of her obstructive sleep apnea, previous also showed normal ESR C-reactive protein,  She was started on nortriptyline  10 mg 2 tablets every night, which did help her sleep, she  is also taking gabapentin  300 mg every night, was not sure about the benefit, tried gabapentin  at daytime without helping her headache  PHYSICAL EXAM:   Vitals:   11/24/23 1606  BP: 134/82  Weight: 132 lb (59.9 kg)  Height: 5' (1.524 m)     Body mass index is 25.78 kg/m.  PHYSICAL EXAMNIATION:  Gen: NAD, conversant, well nourised, well groomed                     Cardiovascular: Regular rate rhythm, no peripheral edema, warm, nontender. Eyes: Conjunctivae clear without exudates or hemorrhage Neck: Supple, no carotid bruits. Pulmonary: Clear to auscultation bilaterally   NEUROLOGICAL EXAM:  MENTAL STATUS: Speech/cognition: awake, alert, oriented to history taking and casual conversation, rely on her family to provide history    11/08/2023    3:00 PM  Montreal Cognitive Assessment   Visuospatial/ Executive (0/5) 4  Naming (0/3) 3  Attention: Read list of digits (0/2) 1  Attention: Read list of letters (0/1) 1  Attention: Serial 7 subtraction starting at 100 (0/3) 1  Language: Repeat phrase (0/2) 2  Language : Fluency (0/1) 1  Abstraction (0/2) 2  Delayed Recall (0/5) 0  Orientation (0/6) 6  Total 21    CRANIAL NERVES: CN II: Visual fields are full to confrontation. Pupils are round equal and briskly reactive to light. CN III, IV, VI: extraocular movement are normal. No ptosis. CN V: Facial sensation is intact to light touch CN VII: Face is symmetric with normal eye closure  CN VIII: Hearing is normal to causal conversation. CN IX, X: Phonation is normal. CN XI: Head turning and shoulder shrug are intact  MOTOR: There is no pronator drift of out-stretched arms. Muscle bulk and tone are normal. Muscle strength is normal.  REFLEXES: Reflexes are 2  and symmetric at the biceps, triceps, knees, and ankles. Plantar responses are flexor.  SENSORY: Intact to light touch   COORDINATION: There is no trunk or limb dysmetria noted.  GAIT/STANCE: Posture is normal.  Gait is steady    REVIEW OF SYSTEMS:  Full 14 system review of systems performed and notable only for as above All other review of systems were negative.   ALLERGIES: Allergies  Allergen Reactions   Penicillin G     Other Reaction(s): Not available  Penicillin   Penicillins Rash    Other Reaction(s): Not available    HOME MEDICATIONS: Current Outpatient Medications  Medication Sig Dispense Refill   Cholecalciferol (VITAMIN D3) 1000 UNITS CAPS Take 1,000 Units by mouth daily.     cyanocobalamin  (VITAMIN B12) 1000 MCG tablet Take 1 tablet (1,000 mcg total) by mouth daily. 90 tablet 1   famotidine  (PEPCID ) 20 MG tablet Take 1 tablet (20 mg total) by mouth at bedtime. 90 tablet 1   gabapentin  (NEURONTIN ) 300 MG capsule Take 1 capsule (300 mg total) by mouth at bedtime. 90 capsule 3   Iron , Ferrous Sulfate , 325 (65 Fe) MG TABS Take 325 mg by mouth every other day. 45 tablet 1   lisinopril-hydrochlorothiazide (ZESTORETIC) 10-12.5 MG tablet TAKE (1) TABLET BY MOUTH ONCE DAILY. 90 tablet 3   metoprolol succinate (TOPROL-XL) 25 MG 24 hr tablet TAKE (1) TABLET BY MOUTH ONCE DAILY. 90 tablet 3   nortriptyline  (PAMELOR ) 10  MG capsule Take 2 capsules (20 mg total) by mouth at bedtime. 60 capsule 11   omeprazole  (PRILOSEC) 20 MG capsule Take 1 capsule (20 mg total) by mouth 2 (two) times daily before a meal. 60 capsule 3   rosuvastatin  (CRESTOR ) 10 MG tablet Take 1 tablet (10 mg total) by mouth daily. 90 tablet 3   valACYclovir  (VALTREX ) 1000 MG tablet 2 g twice daily for 1 day. At the first sign of cold sore. 20 tablet 3   No current facility-administered medications for this visit.    PAST MEDICAL HISTORY: Past Medical History:  Diagnosis Date   Breast cancer (HCC)    High cholesterol    Hypertension    Vitamin D  deficiency     PAST SURGICAL HISTORY: Past Surgical History:  Procedure Laterality Date   BIOPSY  04/27/2023   Procedure: BIOPSY;  Surgeon: Eartha Angelia Sieving, MD;   [facility];  Service: Gastroenterology;;   CESAREAN SECTION     COLONOSCOPY N/A 07/04/2014   Surgeon: Claudis RAYMOND Rivet, MD; incomplete to distal transverse colon secondary to very tortuous sigmoid colon and splenic flexure.  She completed virtual colonoscopy 08/06/2014 which did not show any polyps.   COLONOSCOPY WITH PROPOFOL  N/A 04/27/2023   Procedure: COLONOSCOPY WITH PROPOFOL ;  Surgeon: Eartha Angelia Sieving, MD;  Location: [facility];  Service: Gastroenterology;  Laterality: N/A;  12:45 pm, asa 2   ESOPHAGEAL DILATION N/A 11/05/2020   Procedure: ESOPHAGEAL DILATION;  Surgeon: Rivet Claudis RAYMOND, MD;  Location: [facility];  Service: Endoscopy;  Laterality: N/A;   ESOPHAGOGASTRODUODENOSCOPY (EGD) WITH PROPOFOL  N/A 11/05/2020   Surgeon: Rivet Claudis RAYMOND, MD; 2 cm hiatal hernia, otherwise normal exam.  Empiric esophageal dilation was performed.   ESOPHAGOGASTRODUODENOSCOPY (EGD) WITH PROPOFOL  N/A 04/27/2023   Procedure: ESOPHAGOGASTRODUODENOSCOPY (EGD) WITH PROPOFOL ;  Surgeon: Eartha Angelia Sieving, MD;  Location: [facility];  Service: Gastroenterology;  Laterality: N/A;   MASTECTOMY Right 05/24/2002   RADIOLOGY WITH ANESTHESIA N/A 09/10/2014   Procedure: MRI ABDOMIN WITH AND WITHOUT;  Surgeon: Medication Radiologist, MD;  Location: [facility];  Service: Radiology;  Laterality: N/A;   RECONSTRUCTION BREAST W/ TRAM FLAP      FAMILY HISTORY: Family History  Problem Relation Age of Onset   Hypertension Mother    Lung cancer Mother    Prostate cancer Father    Skin cancer Sister    Melanoma Sister    Cancer Brother    Prostate cancer Brother    Healthy Son    Sleep apnea Neg Hx     SOCIAL HISTORY: Social History   Socioeconomic History   Marital status: Married    Spouse name: Not on file   Number of children: Not on file   Years of education: Not on file   Highest education level: Not on file  Occupational History   Not on file  Tobacco Use   Smoking  status: Never   Smokeless tobacco: Never  Vaping Use   Vaping status: Never Used  Substance and Sexual Activity   Alcohol use: Yes   Drug use: No   Sexual activity: Yes  Other Topics Concern   Not on file  Social History Narrative   Right handed   Caffiene-2 cups coffee   Retired, Manufacturing engineer for [employer]   Social Drivers of Corporate investment banker Strain: Low Risk  (12/31/2022)   Overall Financial Resource Strain (CARDIA)    Difficulty of Paying Living Expenses: Not hard at all  Food Insecurity: No Food Insecurity (12/31/2022)   Hunger Vital Sign    Worried About Running Out of Food in the Last Year: Never true    Ran Out of Food in the Last Year: Never true  Transportation Needs: No Transportation Needs (12/31/2022)   PRAPARE - Administrator, Civil Service (Medical): No    Lack of Transportation (Non-Medical): No  Physical Activity: Sufficiently Active (12/31/2022)   Exercise Vital Sign    Days of Exercise per Week: 7 days    Minutes of Exercise per Session: 30 min  Stress: No Stress Concern Present (12/31/2022)   [employer] of Occupational Health - Occupational Stress Questionnaire    Feeling of Stress : Not at all  Social Connections: Moderately Isolated (12/31/2022)   Social Connection and Isolation Panel    Frequency of Communication with Friends and Family: More than three times a week    Frequency of Social Gatherings with Friends and Family: More than three times a week    Attends Religious Services: Never    Database administrator or Organizations: No    Attends Banker Meetings: Never    Marital Status: Married  Catering manager Violence: Not At Risk (12/31/2022)   Humiliation, Afraid, Rape, and Kick questionnaire    Fear of Current or Ex-Partner: No    Emotionally Abused: No    Physically Abused: No    Sexually Abused: No      Modena Callander, M.D. Ph.D.  [facility] [address] [place], [place] [address] Ph: [phone] Fax: [phone]  CC:  Cook, Jayce G, DO [address] [place],  [place] [address]  Cook, Jayce G, DO    I personally spent a total of 40 minutes in the care of the patient today including preparing to see the patient, getting/reviewing separately obtained history, performing a medically appropriate exam/evaluation, counseling and educating, documenting clinical information in the EHR, independently interpreting results, and communicating results.

## 2023-11-28 ENCOUNTER — Ambulatory Visit: Admitting: Neurology

## 2023-12-15 ENCOUNTER — Ambulatory Visit: Admitting: Family Medicine

## 2023-12-20 NOTE — Telephone Encounter (Signed)
 This encounter was created in error - please disregard.

## 2023-12-28 ENCOUNTER — Other Ambulatory Visit: Payer: Self-pay | Admitting: Neurology

## 2023-12-29 MED ORDER — NORTRIPTYLINE HCL 10 MG PO CAPS: 30.0000 mg | ORAL_CAPSULE | Freq: Every day | ORAL | 3 refills | Status: AC

## 2023-12-29 NOTE — Addendum Note (Signed)
 Addended by: ONEITA NEVELYN BRAVO on: 12/29/2023 05:23 PM   Modules accepted: Orders

## 2024-01-02 ENCOUNTER — Encounter: Payer: Self-pay | Admitting: Family Medicine

## 2024-01-02 ENCOUNTER — Ambulatory Visit: Admitting: Family Medicine

## 2024-01-02 DIAGNOSIS — G309 Alzheimer's disease, unspecified: Secondary | ICD-10-CM

## 2024-01-02 DIAGNOSIS — R519 Headache, unspecified: Secondary | ICD-10-CM | POA: Diagnosis not present

## 2024-01-02 DIAGNOSIS — F0280 Dementia in other diseases classified elsewhere without behavioral disturbance: Secondary | ICD-10-CM | POA: Insufficient documentation

## 2024-01-02 NOTE — Assessment & Plan Note (Signed)
 Recommend Tylenol . Advise to avoid overuse. Will reach out to Neurology.

## 2024-01-02 NOTE — Assessment & Plan Note (Signed)
 Appears to be mild at this time. Continue Namenda .

## 2024-01-02 NOTE — Patient Instructions (Signed)
 I will reach out to neurology.  Take care  Dr. Bluford

## 2024-01-02 NOTE — Progress Notes (Signed)
 Subjective:  Patient ID: Destiny Norman, female    DOB: Oct 09, 1945  Age: 78 y.o. MRN: [id number]  CC:   Chief Complaint  Patient presents with   Alzheimer    HPI:  78 year old female presents with her husband today. They have questions. Recently seen by Neurology and diagnosed with Alzheimer's disease. She was started on Namenda  and is tolerating.   Questions about supplements and medications that she can take for continued headache. Pain is described as a constant pressure. Has not had significant relief with Qulipta , Gabapentin , NSAIDs, Tylenol . Will discuss with neurology.   Patient Active Problem List   Diagnosis Date Noted   Alzheimer disease (HCC) 01/02/2024   Intractable chronic migraine with aura and without status migrainosus 11/08/2023   B12 deficiency 09/19/2023   H/O cold sores 04/18/2023   Tortuous colon 11/08/2022   Iron  deficiency anemia 11/08/2022   Daily headache 10/14/2022   OSA on CPAP 10/14/2022   Essential hypertension 12/23/2020   Mixed hyperlipidemia 12/23/2020   Prediabetes 12/23/2020    Social Hx   Social History   Socioeconomic History   Marital status: Married    Spouse name: Not on file   Number of children: Not on file   Years of education: Not on file   Highest education level: Not on file  Occupational History   Not on file  Tobacco Use   Smoking status: Never   Smokeless tobacco: Never  Vaping Use   Vaping status: Never Used  Substance and Sexual Activity   Alcohol use: Yes   Drug use: No   Sexual activity: Yes  Other Topics Concern   Not on file  Social History Narrative   Right handed   Caffiene-2 cups coffee   Retired, Manufacturing engineer for [employer]   Social Drivers of Corporate investment banker Strain: Low Risk  (12/31/2022)   Overall Financial Resource Strain (CARDIA)    Difficulty of Paying Living Expenses: Not hard at all  Food Insecurity: No Food Insecurity (12/31/2022)   Hunger Vital Sign     Worried About Running Out of Food in the Last Year: Never true    Ran Out of Food in the Last Year: Never true  Transportation Needs: No Transportation Needs (12/31/2022)   PRAPARE - Administrator, Civil Service (Medical): No    Lack of Transportation (Non-Medical): No  Physical Activity: Sufficiently Active (12/31/2022)   Exercise Vital Sign    Days of Exercise per Week: 7 days    Minutes of Exercise per Session: 30 min  Stress: No Stress Concern Present (12/31/2022)   [employer] of Occupational Health - Occupational Stress Questionnaire    Feeling of Stress : Not at all  Social Connections: Moderately Isolated (12/31/2022)   Social Connection and Isolation Panel    Frequency of Communication with Friends and Family: More than three times a week    Frequency of Social Gatherings with Friends and Family: More than three times a week    Attends Religious Services: Never    Database administrator or Organizations: No    Attends Engineer, structural: Never    Marital Status: Married    Review of Systems Per HPI  Objective:  BP (!) 145/87   Pulse 77   Temp (!) 97.3 F (36.3 C)   Ht 5' (1.524 m)   Wt 133 lb (60.3 kg)   SpO2 98%   BMI 25.97 kg/m  01/02/2024    2:29 PM 11/24/2023    4:06 PM 11/08/2023    3:00 PM  BP/Weight  Systolic BP 145 134 150  Diastolic BP 87 82 81  Wt. (Lbs) 133 132   BMI 25.97 kg/m2 25.78 kg/m2     Physical Exam Vitals and nursing note reviewed.  Constitutional:      General: She is not in acute distress.    Appearance: Normal appearance.  HENT:     Head: Normocephalic and atraumatic.  Pulmonary:     Effort: Pulmonary effort is normal. No respiratory distress.  Neurological:     Mental Status: She is alert. Mental status is at baseline.  Psychiatric:        Mood and Affect: Mood normal.        Behavior: Behavior normal.     Lab Results  Component Value Date   WBC 7.2 09/19/2023   HGB 13.1 09/19/2023   HCT 38.5  09/19/2023   PLT 253 09/19/2023   GLUCOSE 84 04/11/2023   CHOL 209 (H) 09/19/2023   TRIG 125 09/19/2023   HDL 51 09/19/2023   LDLCALC 136 (H) 09/19/2023   ALT 10 10/14/2022   AST 19 10/14/2022   NA 140 04/11/2023   K 4.3 04/11/2023   CL 101 04/11/2023   CREATININE 0.97 04/11/2023   BUN 13 04/11/2023   CO2 24 04/11/2023   TSH 2.630 04/13/2021   HGBA1C 5.6 09/19/2023     Assessment & Plan:  Daily headache Assessment & Plan: Recommend Tylenol . Advise to avoid overuse. Will reach out to Neurology.   Alzheimer disease [facility]) Assessment & Plan: Appears to be mild at this time. Continue Namenda .     Thekla Colborn DO [facility]

## 2024-01-06 ENCOUNTER — Ambulatory Visit (INDEPENDENT_AMBULATORY_CARE_PROVIDER_SITE_OTHER): Payer: Medicare PPO

## 2024-01-06 VITALS — Ht 60.0 in | Wt 132.0 lb

## 2024-01-06 DIAGNOSIS — Z0000 Encounter for general adult medical examination without abnormal findings: Secondary | ICD-10-CM

## 2024-01-06 NOTE — Progress Notes (Signed)
 Subjective:   Destiny Norman is a 78 y.o. who presents for a Medicare Wellness preventive visit.  As a reminder, Annual Wellness Visits don't include a physical exam, and some assessments may be limited, especially if this visit is performed virtually. We may recommend an in-person follow-up visit with your provider if needed.  Visit Complete: Virtual I connected with  Destiny Norman on 01/06/24 by a audio enabled telemedicine application and verified that I am speaking with the correct person using two identifiers.  Patient Location: Home  Provider Location: Home Office  I discussed the limitations of evaluation and management by telemedicine. The patient expressed understanding and agreed to proceed.  Vital Signs: Because this visit was a virtual/telehealth visit, some criteria may be missing or patient reported. Any vitals not documented were not able to be obtained and vitals that have been documented are patient reported.  VideoDeclined- This patient declined Librarian, academic. Therefore the visit was completed with audio only.  Persons Participating in Visit: Patient.  AWV Questionnaire: No: Patient Medicare AWV questionnaire was not completed prior to this visit.  Cardiac Risk Factors include: advanced age (>23men, >51 women);hypertension     Objective:    Today's Vitals   01/06/24 1050  Weight: 132 lb (59.9 kg)  Height: 5' (1.524 m)   Body mass index is 25.78 kg/m.     01/06/2024   10:56 AM 04/27/2023   11:17 AM 12/31/2022   11:29 AM 10/30/2020   11:06 AM 02/23/2017    2:36 PM 02/14/2015   10:02 AM 09/05/2014    1:17 PM  Advanced Directives  Does Patient Have a Medical Advance Directive? Yes No Yes Yes Yes  Yes  Yes   Type of Estate agent of [place];Living will  Healthcare Power of [place];Living will Healthcare Power of [place];Living will Healthcare Power of [place];Living will Healthcare Power of  [place];Living will  Living will   Does patient want to make changes to medical advance directive? No - Patient declined  No - Patient declined No - Patient declined No - Patient declined   No - Patient declined   Copy of Healthcare Power of Attorney in Chart? Yes - validated most recent copy scanned in chart (See row information)  Yes - validated most recent copy scanned in chart (See row information) No - copy requested No - copy requested  No - copy requested  No - copy requested   Would patient like information on creating a medical advance directive?  No - Patient declined          Data saved with a previous flowsheet row definition    Current Medications (verified) Outpatient Encounter Medications as of 01/06/2024  Medication Sig   Cholecalciferol (VITAMIN D3) 1000 UNITS CAPS Take 1,000 Units by mouth daily.   cyanocobalamin  (VITAMIN B12) 1000 MCG tablet Take 1 tablet (1,000 mcg total) by mouth daily.   famotidine  (PEPCID ) 20 MG tablet Take 1 tablet (20 mg total) by mouth at bedtime.   gabapentin  (NEURONTIN ) 300 MG capsule Take 1 capsule (300 mg total) by mouth at bedtime.   Iron , Ferrous Sulfate , 325 (65 Fe) MG TABS Take 325 mg by mouth every other day.   lisinopril-hydrochlorothiazide (ZESTORETIC) 10-12.5 MG tablet TAKE (1) TABLET BY MOUTH ONCE DAILY.   memantine  (NAMENDA ) 10 MG tablet Take 1 tablet (10 mg total) by mouth 2 (two) times daily.   metoprolol succinate (TOPROL-XL) 25 MG 24 hr tablet TAKE (1) TABLET BY MOUTH  ONCE DAILY.   nortriptyline  (PAMELOR ) 10 MG capsule Take 3 capsules (30 mg total) by mouth at bedtime.   omeprazole  (PRILOSEC) 20 MG capsule Take 1 capsule (20 mg total) by mouth 2 (two) times daily before a meal.   rosuvastatin  (CRESTOR ) 10 MG tablet Take 1 tablet (10 mg total) by mouth daily.   valACYclovir  (VALTREX ) 1000 MG tablet 2 g twice daily for 1 day. At the first sign of cold sore.   No facility-administered encounter medications on file as of 01/06/2024.     Allergies (verified) Penicillin g and Penicillins   History: Past Medical History:  Diagnosis Date   Breast cancer (HCC)    High cholesterol    Hypertension    Vitamin D  deficiency    Past Surgical History:  Procedure Laterality Date   BIOPSY  04/27/2023   Procedure: BIOPSY;  Surgeon: Eartha Angelia Sieving, MD;  [facility];  Service: Gastroenterology;;   CESAREAN SECTION     COLONOSCOPY N/A 07/04/2014   Surgeon: Claudis RAYMOND Rivet, MD; incomplete to distal transverse colon secondary to very tortuous sigmoid colon and splenic flexure.  She completed virtual colonoscopy 08/06/2014 which did not show any polyps.   COLONOSCOPY WITH PROPOFOL  N/A 04/27/2023   Procedure: COLONOSCOPY WITH PROPOFOL ;  Surgeon: Eartha Angelia Sieving, MD;  [facility];  Service: Gastroenterology;  Laterality: N/A;  12:45 pm, asa 2   ESOPHAGEAL DILATION N/A 11/05/2020   Procedure: ESOPHAGEAL DILATION;  Surgeon: Rivet Claudis RAYMOND, MD;  [facility]: [facility];  Service: Endoscopy;  Laterality: N/A;   ESOPHAGOGASTRODUODENOSCOPY (EGD) WITH PROPOFOL  N/A 11/05/2020   Surgeon: Rivet Claudis RAYMOND, MD; 2 cm hiatal hernia, otherwise normal exam.  Empiric esophageal dilation was performed.   ESOPHAGOGASTRODUODENOSCOPY (EGD) WITH PROPOFOL  N/A 04/27/2023   Procedure: ESOPHAGOGASTRODUODENOSCOPY (EGD) WITH PROPOFOL ;  Surgeon: Eartha Angelia Sieving, MD;  [facility];  Service: Gastroenterology;  Laterality: N/A;   MASTECTOMY Right 05/24/2002   RADIOLOGY WITH ANESTHESIA N/A 09/10/2014   Procedure: MRI ABDOMIN WITH AND WITHOUT;  Surgeon: Medication Radiologist, MD;  Location: [facility];  Service: Radiology;  Laterality: N/A;   RECONSTRUCTION BREAST W/ TRAM FLAP     Family History  Problem Relation Age of Onset   Hypertension Mother    Lung cancer Mother    Prostate cancer Father    Skin cancer Sister    Melanoma Sister    Cancer Brother    Prostate cancer Brother    Healthy Son     Sleep apnea Neg Hx    Social History   Socioeconomic History   Marital status: Married    Spouse name: Not on file   Number of children: Not on file   Years of education: Not on file   Highest education level: Not on file  Occupational History   Not on file  Tobacco Use   Smoking status: Never   Smokeless tobacco: Never  Vaping Use   Vaping status: Never Used  Substance and Sexual Activity   Alcohol use: Yes   Drug use: No   Sexual activity: Yes  Other Topics Concern   Not on file  Social History Narrative   Right handed   Caffiene-2 cups coffee   Retired, Manufacturing engineer for [employer]   Social Drivers of Health   Financial Resource Strain: Low Risk  (01/06/2024)   Overall Financial Resource Strain (CARDIA)    Difficulty of Paying Living Expenses: Not hard at all  Food Insecurity: No Food Insecurity (01/06/2024)  Hunger Vital Sign    Worried About Running Out of Food in the Last Year: Never true    Ran Out of Food in the Last Year: Never true  Transportation Needs: No Transportation Needs (01/06/2024)   PRAPARE - Administrator, Civil Service (Medical): No    Lack of Transportation (Non-Medical): No  Physical Activity: Sufficiently Active (01/06/2024)   Exercise Vital Sign    Days of Exercise per Week: 5 days    Minutes of Exercise per Session: 30 min  Stress: No Stress Concern Present (01/06/2024)   [employer] of Occupational Health - Occupational Stress Questionnaire    Feeling of Stress: Not at all  Social Connections: Moderately Isolated (01/06/2024)   Social Connection and Isolation Panel    Frequency of Communication with Friends and Family: More than three times a week    Frequency of Social Gatherings with Friends and Family: More than three times a week    Attends Religious Services: Never    Database administrator or Organizations: No    Attends Engineer, structural: Never    Marital Status: Married    Tobacco  Counseling Counseling given: Not Answered    Clinical Intake:  Pre-visit preparation completed: Yes  Pain : No/denies pain  Diabetes: No  Lab Results  Component Value Date   HGBA1C 5.6 09/19/2023   HGBA1C 6.3 (H) 10/14/2022     How often do you need to have someone help you when you read instructions, pamphlets, or other written materials from your doctor or pharmacy?: 1 - Never  Interpreter Needed?: No  Information entered by :: Charmaine Bloodgood LPN   Activities of Daily Living     01/06/2024   10:56 AM  In your present state of health, do you have any difficulty performing the following activities:  Hearing? 0  Vision? 0  Difficulty concentrating or making decisions? 1  Walking or climbing stairs? 0  Dressing or bathing? 0  Doing errands, shopping? 0  Preparing Food and eating ? N  Using the Toilet? N  In the past six months, have you accidently leaked urine? N  Do you have problems with loss of bowel control? N  Managing your Medications? N  Managing your Finances? N  Housekeeping or managing your Housekeeping? N    Patient Care Team: Cook, Jayce G, DO as PCP - General (Family Medicine) Leora Lenis, MD as Consulting Physician (Plastic Surgery) Elliot Charm, MD as Referring Physician (Internal Medicine) Eartha Flavors, Toribio, MD as Consulting Physician (Gastroenterology) Abigail Maude [facility]) Onita Duos, MD as Consulting Physician (Neurology)  I have updated your Care Teams any recent Medical Services you may have received from other providers in the past year.     Assessment:   This is a routine wellness examination for Jasiyah.  Hearing/Vision screen Hearing Screening - Comments:: Denies hearing difficulties   Vision Screening - Comments:: No vision problems; will schedule routine eye exam    Goals Addressed             This Visit's Progress    Patient Stated   On track    Increase activity, eat healthier, and travel  more       Depression Screen    01/06/2024   10:53 AM 01/02/2024    2:33 PM 09/19/2023   10:34 AM 04/18/2023   10:05 AM 12/31/2022   11:35 AM 10/14/2022    9:40 AM  PHQ 2/9 Scores  PHQ - 2 Score  3 3 1 1  0 2  PHQ- 9 Score 5 5 5 1  8     Fall Risk     01/06/2024   10:55 AM 01/02/2024    2:33 PM 09/19/2023   10:34 AM 04/18/2023   10:05 AM 12/31/2022   11:38 AM  Fall Risk   Falls in the past year? 0 0 0 0 0  Number falls in past yr: 0    0  Injury with Fall? 0 0   0  Risk for fall due to : No Fall Risks    No Fall Risks  Follow up Falls prevention discussed;Education provided;Falls evaluation completed    Falls prevention discussed    MEDICARE RISK AT HOME:  Medicare Risk at Home Any stairs in or around the home?: No If so, are there any without handrails?: No Home free of loose throw rugs in walkways, pet beds, electrical cords, etc?: Yes Adequate lighting in your home to reduce risk of falls?: Yes Life alert?: No Use of a cane, walker or w/c?: No Grab bars in the bathroom?: Yes Shower chair or bench in shower?: No Elevated toilet seat or a handicapped toilet?: Yes  TIMED UP AND GO:  Was the test performed?  No  Cognitive Function: Impaired: Patient has current diagnosis of cognitive impairment.      11/08/2023    3:00 PM  Montreal Cognitive Assessment   Visuospatial/ Executive (0/5) 4  Naming (0/3) 3  Attention: Read list of digits (0/2) 1  Attention: Read list of letters (0/1) 1  Attention: Serial 7 subtraction starting at 100 (0/3) 1  Language: Repeat phrase (0/2) 2  Language : Fluency (0/1) 1  Abstraction (0/2) 2  Delayed Recall (0/5) 0  Orientation (0/6) 6  Total 21      12/31/2022   11:33 AM  6CIT Screen  What Year? 0 points  What month? 0 points  What time? 0 points  Count back from 20 0 points  Months in reverse 0 points  Repeat phrase 0 points  Total Score 0 points    Immunizations Immunization History  Administered Date(s) Administered    Zoster Recombinant(Shingrix) 05/29/2018    Screening Tests Health Maintenance  Topic Date Due   COVID-19 Vaccine (1) Never done   DTaP/Tdap/Td (1 - Tdap) Never done   Pneumococcal Vaccine: 50+ Years (1 of 1 - PCV) Never done   Zoster Vaccines- Shingrix (2 of 2) 07/24/2018   INFLUENZA VACCINE  12/23/2023   Medicare Annual Wellness (AWV)  01/05/2025   Colonoscopy  04/26/2033   DEXA SCAN  Completed   HPV VACCINES  Aged Out   Meningococcal B Vaccine  Aged Out   Hepatitis C Screening  Discontinued    Health Maintenance  Health Maintenance Due  Topic Date Due   COVID-19 Vaccine (1) Never done   DTaP/Tdap/Td (1 - Tdap) Never done   Pneumococcal Vaccine: 50+ Years (1 of 1 - PCV) Never done   Zoster Vaccines- Shingrix (2 of 2) 07/24/2018   INFLUENZA VACCINE  12/23/2023   Health Maintenance Items Addressed: Information provided on vaccine recommendations   Additional Screening:  Vision Screening: Recommended annual ophthalmology exams for early detection of glaucoma and other disorders of the eye. Would you like a referral to an eye doctor? No    Dental Screening: Recommended annual dental exams for proper oral hygiene  Community Resource Referral / Chronic Care Management: CRR required this visit?  No   CCM required this visit?  No  Plan:    I have personally reviewed and noted the following in the patient's chart:   Medical and social history Use of alcohol, tobacco or illicit drugs  Current medications and supplements including opioid prescriptions. Patient is not currently taking opioid prescriptions. Functional ability and status Nutritional status Physical activity Advanced directives List of other physicians Hospitalizations, surgeries, and ER visits in previous 12 months Vitals Screenings to include cognitive, depression, and falls Referrals and appointments  In addition, I have reviewed and discussed with patient certain preventive protocols, quality  metrics, and best practice recommendations. A written personalized care plan for preventive services as well as general preventive health recommendations were provided to patient.   Lavelle Pfeiffer [place], [place]   1/84/7974   After Visit Summary: (MyChart) Due to this being a telephonic visit, the after visit summary with patients personalized plan was offered to patient via MyChart   Notes: Nothing significant to report at this time.

## 2024-01-06 NOTE — Patient Instructions (Signed)
 Destiny Norman , Thank you for taking time out of your busy schedule to complete your Annual Wellness Visit with me. I enjoyed our conversation and look forward to speaking with you again next year. I, as well as your care team,  appreciate your ongoing commitment to your health goals. Please review the following plan we discussed and let me know if I can assist you in the future. Your Game plan/ To Do List     Follow up Visits: We will see or speak with you next year for your Next Medicare AWV with our clinical staff Have you seen your provider in the last 6 months (3 months if uncontrolled diabetes)? Yes  Clinician Recommendations:  Aim for 30 minutes of exercise or brisk walking, 6-8 glasses of water , and 5 servings of fruits and vegetables each day.       This is a list of the screenings recommended for you:  Health Maintenance  Topic Date Due   COVID-19 Vaccine (1) Never done   DTaP/Tdap/Td vaccine (1 - Tdap) Never done   Pneumococcal Vaccine for age over 45 (1 of 1 - PCV) Never done   Zoster (Shingles) Vaccine (2 of 2) 07/24/2018   Flu Shot  12/23/2023   Medicare Annual Wellness Visit  01/05/2025   Colon Cancer Screening  04/26/2033   DEXA scan (bone density measurement)  Completed   HPV Vaccine  Aged Out   Meningitis B Vaccine  Aged Out   Hepatitis C Screening  Discontinued    Advanced directives: (ACP Link)Information on Advanced Care Planning can be found at [place]  Secretary of [facility] Advance Health Care Directives Advance Health Care Directives. [email or web address]   Advance Care Planning is important because it:  [x]  Makes sure you receive the medical care that is consistent with your values, goals, and preferences  [x]  It provides guidance to your family and loved ones and reduces their decisional burden about whether or not they are making the right decisions based on your wishes.  Follow the link provided in your after visit summary or read over the paperwork we have mailed  to you to help you started getting your Advance Directives in place. If you need assistance in completing these, please reach out to us  so that we can help you!  See attachments for Preventive Care and Fall Prevention Tips.

## 2024-01-10 ENCOUNTER — Telehealth: Payer: Medicare PPO | Admitting: Adult Health

## 2024-02-07 ENCOUNTER — Encounter: Payer: Self-pay | Admitting: Adult Health

## 2024-02-07 ENCOUNTER — Ambulatory Visit: Admitting: Adult Health

## 2024-02-07 VITALS — BP 147/70 | HR 62 | Ht 59.0 in | Wt 135.0 lb

## 2024-02-07 DIAGNOSIS — R519 Headache, unspecified: Secondary | ICD-10-CM

## 2024-02-07 DIAGNOSIS — F0280 Dementia in other diseases classified elsewhere without behavioral disturbance: Secondary | ICD-10-CM | POA: Diagnosis not present

## 2024-02-07 DIAGNOSIS — G4733 Obstructive sleep apnea (adult) (pediatric): Secondary | ICD-10-CM | POA: Diagnosis not present

## 2024-02-07 DIAGNOSIS — G309 Alzheimer's disease, unspecified: Secondary | ICD-10-CM | POA: Diagnosis not present

## 2024-02-07 NOTE — Patient Instructions (Signed)
 Your Plan:  Continue using CPAP nightly and greater than 4 hours each night Continue nortriptyline  30 mg at bedtime Continue Namenda  10 mg twice a day     Thank you for coming to see us  at [facility]. I hope we have been able to provide you high quality care today.  You may receive a patient satisfaction survey over the next few weeks. We would appreciate your feedback and comments so that we may continue to improve ourselves and the health of our patients.

## 2024-02-07 NOTE — Progress Notes (Signed)
 PATIENT: Destiny Norman DOB: Aug 28, 1945  REASON FOR VISIT: follow up HISTORY FROM: patient PRIMARY NEUROLOGIST: Dr. Buck  Chief Complaint  Patient presents with   Follow-up    Pt in 18 with husband Pt here for cpap f/u Pt states no questions or concerns for today's visit      HISTORY OF PRESENT ILLNESS: Today 02/07/24: Destiny Norman is a 78 y.o. female with a history of obstructive sleep apnea. Returns today for follow-up.  She reports that CPAP machine is working well.  She currently has the nasal pillows.  On occasion she feels the mask leaking but is not bothersome to her.  She is not interested in doing a mask refitting.  Her download is below.  She recently saw Dr. Onita and had blood work that revealed positive ATN and APO E markers.  She is currently on Namenda  10 mg twice a day and tolerating it well.  Her husband reports that she is not complaining as much of a daily headache.  She remains on gabapentin  and nortriptyline .  Returns today for an evaluation.     07/05/23: Destiny Norman is a 78 y.o. female with a history of OSA on CPAP. Returns today for follow-up.  She reports that the CPAP works okay.  She states that she has not noticed the benefit.  Continues to wake up with a headache.  She states initially she was using the machine the entire night however now if she wakes up and she used it for for 5 hours she will take it off and then sleep an additional 3 hours.   Always wake up with a headache. Currently taking Gabapentin  100-200 mg in the AM and 300 mg at bedtime. Finds it helpful but it has not eliminated headaches. No headache free days. But there are days that she feels better than others.  Headache is always in the occipital region in the neck.  Feels tight and pressure sensation.  Feels that her headache started 1 to 2 years ago when she was doing yard work.  Went to headache center and saw Dr. Oneita- who did trigger point injections. Tried  Topamax with her PCP. Reports that Dr. Oneita prescribed a medicine but she doesn't recall the name but she could not tolerate it.        REVIEW OF SYSTEMS: Out of a complete 14 system review of symptoms, the patient complains only of the following symptoms, and all other reviewed systems are negative.    ALLERGIES: Allergies  Allergen Reactions   Penicillin G     Other Reaction(s): Not available  Penicillin   Penicillins Rash    Other Reaction(s): Not available    HOME MEDICATIONS: Outpatient Medications Prior to Visit  Medication Sig Dispense Refill   gabapentin  (NEURONTIN ) 300 MG capsule Take 1 capsule (300 mg total) by mouth at bedtime. (Patient taking differently: Take 300 mg by mouth as needed.) 90 capsule 3   valACYclovir  (VALTREX ) 1000 MG tablet 2 g twice daily for 1 day. At the first sign of cold sore. (Patient taking differently: as needed. 2 g twice daily for 1 day. At the first sign of cold sore.) 20 tablet 3   Cholecalciferol (VITAMIN D3) 1000 UNITS CAPS Take 1,000 Units by mouth daily.     cyanocobalamin  (VITAMIN B12) 1000 MCG tablet Take 1 tablet (1,000 mcg total) by mouth daily. 90 tablet 1   famotidine  (PEPCID ) 20 MG tablet Take 1 tablet (20 mg total) by mouth  at bedtime. 90 tablet 1   Iron , Ferrous Sulfate , 325 (65 Fe) MG TABS Take 325 mg by mouth every other day. 45 tablet 1   lisinopril-hydrochlorothiazide (ZESTORETIC) 10-12.5 MG tablet TAKE (1) TABLET BY MOUTH ONCE DAILY. 90 tablet 3   memantine  (NAMENDA ) 10 MG tablet Take 1 tablet (10 mg total) by mouth 2 (two) times daily. 60 tablet 11   metoprolol succinate (TOPROL-XL) 25 MG 24 hr tablet TAKE (1) TABLET BY MOUTH ONCE DAILY. 90 tablet 3   nortriptyline  (PAMELOR ) 10 MG capsule Take 3 capsules (30 mg total) by mouth at bedtime. 270 capsule 3   omeprazole  (PRILOSEC) 20 MG capsule Take 1 capsule (20 mg total) by mouth 2 (two) times daily before a meal. 60 capsule 3   rosuvastatin  (CRESTOR ) 10 MG tablet Take 1  tablet (10 mg total) by mouth daily. 90 tablet 3   No facility-administered medications prior to visit.    PAST MEDICAL HISTORY: Past Medical History:  Diagnosis Date   Breast cancer (HCC)    High cholesterol    Hypertension    Vitamin D  deficiency     PAST SURGICAL HISTORY: Past Surgical History:  Procedure Laterality Date   BIOPSY  04/27/2023   Procedure: BIOPSY;  Surgeon: Eartha Angelia Sieving, MD;  Location: [facility];  Service: Gastroenterology;;   CESAREAN SECTION     COLONOSCOPY N/A 07/04/2014   Surgeon: Claudis RAYMOND Rivet, MD; incomplete to distal transverse colon secondary to very tortuous sigmoid colon and splenic flexure.  She completed virtual colonoscopy 08/06/2014 which did not show any polyps.   COLONOSCOPY WITH PROPOFOL  N/A 04/27/2023   Procedure: COLONOSCOPY WITH PROPOFOL ;  Surgeon: Eartha Angelia Sieving, MD;  Location: [facility];  Service: Gastroenterology;  Laterality: N/A;  12:45 pm, asa 2   ESOPHAGEAL DILATION N/A 11/05/2020   Procedure: ESOPHAGEAL DILATION;  Surgeon: Rivet Claudis RAYMOND, MD;  Location: [facility];  Service: Endoscopy;  Laterality: N/A;   ESOPHAGOGASTRODUODENOSCOPY (EGD) WITH PROPOFOL  N/A 11/05/2020   Surgeon: Rivet Claudis RAYMOND, MD; 2 cm hiatal hernia, otherwise normal exam.  Empiric esophageal dilation was performed.   ESOPHAGOGASTRODUODENOSCOPY (EGD) WITH PROPOFOL  N/A 04/27/2023   Procedure: ESOPHAGOGASTRODUODENOSCOPY (EGD) WITH PROPOFOL ;  Surgeon: Eartha Angelia Sieving, MD;  Location: [facility];  Service: Gastroenterology;  Laterality: N/A;   MASTECTOMY Right 05/24/2002   RADIOLOGY WITH ANESTHESIA N/A 09/10/2014   Procedure: MRI ABDOMIN WITH AND WITHOUT;  Surgeon: Medication Radiologist, MD;  Location: [facility];  Service: Radiology;  Laterality: N/A;   RECONSTRUCTION BREAST W/ TRAM FLAP      FAMILY HISTORY: Family History  Problem Relation Age of Onset   Hypertension Mother    Lung cancer Mother    Prostate cancer Father     Skin cancer Sister    Melanoma Sister    Cancer Brother    Prostate cancer Brother    Healthy Son    Sleep apnea Neg Hx     SOCIAL HISTORY: Social History   Socioeconomic History   Marital status: Married    Spouse name: Not on file   Number of children: Not on file   Years of education: Not on file   Highest education level: Not on file  Occupational History   Not on file  Tobacco Use   Smoking status: Never   Smokeless tobacco: Never  Vaping Use   Vaping status: Never Used  Substance and Sexual Activity   Alcohol use: Yes   Drug use: No   Sexual activity:  Yes  Other Topics Concern   Not on file  Social History Narrative   Right handed   Caffiene-2 cups coffee   Retired, Manufacturing engineer for [employer]   Social Drivers of Health   Financial Resource Strain: Low Risk  (01/06/2024)   Overall Financial Resource Strain (CARDIA)    Difficulty of Paying Living Expenses: Not hard at all  Food Insecurity: No Food Insecurity (01/06/2024)   Hunger Vital Sign    Worried About Running Out of Food in the Last Year: Never true    Ran Out of Food in the Last Year: Never true  Transportation Needs: No Transportation Needs (01/06/2024)   PRAPARE - Administrator, Civil Service (Medical): No    Lack of Transportation (Non-Medical): No  Physical Activity: Sufficiently Active (01/06/2024)   Exercise Vital Sign    Days of Exercise per Week: 5 days    Minutes of Exercise per Session: 30 min  Stress: No Stress Concern Present (01/06/2024)   [employer] of Occupational Health - Occupational Stress Questionnaire    Feeling of Stress: Not at all  Social Connections: Moderately Isolated (01/06/2024)   Social Connection and Isolation Panel    Frequency of Communication with Friends and Family: More than three times a week    Frequency of Social Gatherings with Friends and Family: More than three times a week    Attends Religious Services: Never    Automotive engineer or Organizations: No    Attends Banker Meetings: Never    Marital Status: Married  Catering manager Violence: Not At Risk (01/06/2024)   Humiliation, Afraid, Rape, and Kick questionnaire    Fear of Current or Ex-Partner: No    Emotionally Abused: No    Physically Abused: No    Sexually Abused: No      PHYSICAL EXAM  Vitals:   02/07/24 1127  BP: (!) 147/70  Pulse: 62  Weight: 135 lb (61.2 kg)  Height: 4' 11 (1.499 m)   Body mass index is 27.27 kg/m.  Generalized: Well developed, in no acute distress  Chest: Lungs clear to auscultation bilaterally  Neurological examination  Mentation: Alert oriented to time, place, history taking. Follows all commands speech and language fluent Cranial nerve II-XII: Extraocular movements were full, visual field were full on confrontational test Head turning and shoulder shrug  were normal and symmetric. Motor: The motor testing reveals 5 over 5 strength of all 4 extremities. Good symmetric motor tone is noted throughout.  Sensory: Sensory testing is intact to soft touch on all 4 extremities. No evidence of extinction is noted.  Gait and station: Gait is normal.    DIAGNOSTIC DATA (LABS, IMAGING, TESTING) - I reviewed patient records, labs, notes, testing and imaging myself where available.  Lab Results  Component Value Date   WBC 7.2 09/19/2023   HGB 13.1 09/19/2023   HCT 38.5 09/19/2023   MCV 94 09/19/2023   PLT 253 09/19/2023      Component Value Date/Time   NA 140 04/11/2023 1206   K 4.3 04/11/2023 1206   CL 101 04/11/2023 1206   CO2 24 04/11/2023 1206   GLUCOSE 84 04/11/2023 1206   GLUCOSE 108 (H) 10/30/2020 1053   BUN 13 04/11/2023 1206   CREATININE 0.97 04/11/2023 1206   CALCIUM  9.4 04/11/2023 1206   PROT 7.0 10/14/2022 1024   ALBUMIN 4.5 10/14/2022 1024   AST 19 10/14/2022 1024   ALT 10 10/14/2022 1024   ALKPHOS  104 10/14/2022 1024   BILITOT 0.2 10/14/2022 1024   GFRNONAA >60  10/30/2020 1053   GFRAA >60 02/14/2015 0955   Lab Results  Component Value Date   CHOL 209 (H) 09/19/2023   HDL 51 09/19/2023   LDLCALC 136 (H) 09/19/2023   TRIG 125 09/19/2023   CHOLHDL 4.1 09/19/2023   Lab Results  Component Value Date   HGBA1C 5.6 09/19/2023   Lab Results  Component Value Date   VITAMINB12 1,244 09/19/2023   Lab Results  Component Value Date   TSH 2.630 04/13/2021      ASSESSMENT AND PLAN 78 y.o. year old female  has a past medical history of Breast cancer (HCC), High cholesterol, Hypertension, and Vitamin D  deficiency. here with:  OSA on CPAP  - CPAP compliance excellent - Good treatment of AHI  - Encourage patient to use CPAP nightly and > 4 hours each night - Discussed mask refitting but she deferred  2.  Alzheimer's disease  -Continue Namenda  10 mg twice a day - Memory testing at the next visit  3.  Daily headaches  -Continue nortriptyline  30 mg at bedtime - Gabapentin  managed by PCP   Follow-up in 8 to 10 months or sooner if needed    Duwaine Russell, MSN, NP-C 02/07/2024, 11:40 AM [facility] [address] [place], [place] [address] [phone]  The patient's condition requires frequent monitoring and adjustments in the treatment plan, reflecting the ongoing complexity of care.  This provider is the continuing focal point for all needed services for this condition.

## 2024-02-16 ENCOUNTER — Encounter (HOSPITAL_COMMUNITY): Payer: Self-pay

## 2024-02-16 ENCOUNTER — Emergency Department (HOSPITAL_COMMUNITY)
Admission: EM | Admit: 2024-02-16 | Discharge: 2024-02-16 | Disposition: A | Discharge status: Home / Self Care | Attending: Emergency Medicine | Admitting: Emergency Medicine

## 2024-02-16 ENCOUNTER — Emergency Department (HOSPITAL_COMMUNITY)

## 2024-02-16 ENCOUNTER — Other Ambulatory Visit: Payer: Self-pay

## 2024-02-16 DIAGNOSIS — F0280 Dementia in other diseases classified elsewhere without behavioral disturbance: Secondary | ICD-10-CM | POA: Insufficient documentation

## 2024-02-16 DIAGNOSIS — Y92009 Unspecified place in unspecified non-institutional (private) residence as the place of occurrence of the external cause: Secondary | ICD-10-CM | POA: Diagnosis not present

## 2024-02-16 DIAGNOSIS — G309 Alzheimer's disease, unspecified: Secondary | ICD-10-CM | POA: Diagnosis not present

## 2024-02-16 DIAGNOSIS — Z79899 Other long term (current) drug therapy: Secondary | ICD-10-CM | POA: Diagnosis not present

## 2024-02-16 DIAGNOSIS — I10 Essential (primary) hypertension: Secondary | ICD-10-CM | POA: Diagnosis not present

## 2024-02-16 DIAGNOSIS — R102 Pelvic and perineal pain: Secondary | ICD-10-CM | POA: Diagnosis not present

## 2024-02-16 DIAGNOSIS — W108XXA Fall (on) (from) other stairs and steps, initial encounter: Secondary | ICD-10-CM | POA: Insufficient documentation

## 2024-02-16 DIAGNOSIS — M25552 Pain in left hip: Secondary | ICD-10-CM | POA: Insufficient documentation

## 2024-02-16 DIAGNOSIS — S22080A Wedge compression fracture of T11-T12 vertebra, initial encounter for closed fracture: Secondary | ICD-10-CM | POA: Diagnosis not present

## 2024-02-16 DIAGNOSIS — S22088A Other fracture of T11-T12 vertebra, initial encounter for closed fracture: Secondary | ICD-10-CM | POA: Diagnosis not present

## 2024-02-16 DIAGNOSIS — M47816 Spondylosis without myelopathy or radiculopathy, lumbar region: Secondary | ICD-10-CM | POA: Diagnosis not present

## 2024-02-16 DIAGNOSIS — M545 Low back pain, unspecified: Secondary | ICD-10-CM | POA: Diagnosis not present

## 2024-02-16 DIAGNOSIS — S299XXA Unspecified injury of thorax, initial encounter: Secondary | ICD-10-CM | POA: Diagnosis present

## 2024-02-16 HISTORY — DX: Alzheimer's disease with early onset: G30.0

## 2024-02-16 MED ORDER — LIDOCAINE 5 % EX PTCH
1.0000 | MEDICATED_PATCH | CUTANEOUS | Status: DC
  Administered 2024-02-16: 1 via TRANSDERMAL
  Filled 2024-02-16: qty 1

## 2024-02-16 MED ORDER — LIDOCAINE 5 % EX PTCH: 1.0000 | MEDICATED_PATCH | CUTANEOUS | 0 refills | Status: AC

## 2024-02-16 MED ORDER — HYDROCODONE-ACETAMINOPHEN 5-325 MG PO TABS: 1.0000 | ORAL_TABLET | Freq: Four times a day (QID) | ORAL | 0 refills | Status: AC | PRN

## 2024-02-16 MED ORDER — HYDROCODONE-ACETAMINOPHEN 5-325 MG PO TABS
1.0000 | ORAL_TABLET | Freq: Once | ORAL | Status: AC
  Administered 2024-02-16: 1 via ORAL
  Filled 2024-02-16: qty 1

## 2024-02-16 NOTE — ED Provider Notes (Signed)
 [place] EMERGENCY DEPARTMENT AT [facility] Provider Note   CSN: [id number] Arrival date & time: 02/16/24  1020     Patient presents with: Back Pain   Destiny Norman is a 78 y.o. female with a history of hypertension, Alzheimer's dementia hyperlipidemia presenting for evaluation of low back pain which started when she fell in her home.  She was walking down a flight of steps and when she got to the second from the bottom step she slipped and fell to the hallway floor landing on her buttocks and left hip area, she describes twisting her torso during the injury and has severe pain in her since.  Her torso did not hit the floor, she did not hit her head.  She denies headache, neck pain and denies pain in her extremities.  She has had no treatment prior to arrival.  She has significant midline back pain with any movement.  Denies weakness or numbness in her arms or legs.   The history is provided by the patient.       Prior to Admission medications   Medication Sig Start Date End Date Taking? Authorizing Provider  HYDROcodone -acetaminophen  (NORCO/VICODIN) 5-325 MG tablet Take 1 tablet by mouth every 6 (six) hours as needed. 02/16/24  Yes Anabelle Bungert, Mliss, PA-C  lidocaine  (LIDODERM ) 5 % Place 1 patch onto the skin daily. Remove & Discard patch within 12 hours or as directed by MD 02/16/24  Yes Tecla Mailloux, PA-C  Cholecalciferol (VITAMIN D3) 1000 UNITS CAPS Take 1,000 Units by mouth daily.    [provider]  cyanocobalamin  (VITAMIN B12) 1000 MCG tablet Take 1 tablet (1,000 mcg total) by mouth daily. 10/23/22   Cook, Jayce G, DO  famotidine  (PEPCID ) 20 MG tablet Take 1 tablet (20 mg total) by mouth at bedtime. 10/20/23 10/19/24  Kennedy Charmaine CROME, NP  gabapentin  (NEURONTIN ) 300 MG capsule Take 1 capsule (300 mg total) by mouth at bedtime. Patient taking differently: Take 300 mg by mouth as needed. 10/10/23   Cook, Jayce G, DO  Iron , Ferrous Sulfate , 325 (65 Fe) MG TABS Take 325  mg by mouth every other day. 10/23/22   Cook, Jayce G, DO  lisinopril-hydrochlorothiazide (ZESTORETIC) 10-12.5 MG tablet TAKE (1) TABLET BY MOUTH ONCE DAILY. 07/10/23   Cook, Jayce G, DO  memantine  (NAMENDA ) 10 MG tablet Take 1 tablet (10 mg total) by mouth 2 (two) times daily. 11/24/23   Onita Duos, MD  metoprolol succinate (TOPROL-XL) 25 MG 24 hr tablet TAKE (1) TABLET BY MOUTH ONCE DAILY. 07/11/23   Cook, Jayce G, DO  nortriptyline  (PAMELOR ) 10 MG capsule Take 3 capsules (30 mg total) by mouth at bedtime. 12/29/23   Onita Duos, MD  omeprazole  (PRILOSEC) 20 MG capsule Take 1 capsule (20 mg total) by mouth 2 (two) times daily before a meal. 10/31/23   Mahon, Charmaine CROME, NP  rosuvastatin  (CRESTOR ) 10 MG tablet Take 1 tablet (10 mg total) by mouth daily. 08/29/23   Cook, Jayce G, DO  valACYclovir  (VALTREX ) 1000 MG tablet 2 g twice daily for 1 day. At the first sign of cold sore. Patient taking differently: as needed. 2 g twice daily for 1 day. At the first sign of cold sore. 04/18/23   Cook, Jayce G, DO    Allergies: Penicillin g and Penicillins    Review of Systems  Constitutional:  Negative for fever.  Musculoskeletal:  Positive for joint swelling. Negative for arthralgias and myalgias.  Neurological:  Negative for weakness and numbness.  Updated Vital Signs BP 112/63   Pulse 74   Temp 97.6 F (36.4 C) (Temporal)   Resp 16   Ht 4' 11 (1.499 m)   Wt 61.2 kg   SpO2 98%   BMI 27.27 kg/m   Physical Exam Vitals and nursing note reviewed.  Constitutional:      Appearance: She is well-developed.  HENT:     Head: Normocephalic.  Eyes:     Conjunctiva/sclera: Conjunctivae normal.  Cardiovascular:     Rate and Rhythm: Normal rate.     Pulses: Normal pulses.     Comments: Pedal pulses normal. Pulmonary:     Effort: Pulmonary effort is normal.  Abdominal:     General: Bowel sounds are normal. There is no distension.     Palpations: Abdomen is soft. There is no mass.  Musculoskeletal:         General: Normal range of motion.     Cervical back: Normal range of motion and neck supple.     Thoracic back: Tenderness present. No swelling, edema or deformity.     Lumbar back: Tenderness present. No swelling, edema or deformity.     Left hip: Bony tenderness present. No deformity. Normal range of motion.       Legs:  Skin:    General: Skin is warm and dry.  Neurological:     General: No focal deficit present.     Mental Status: She is alert.     Sensory: No sensory deficit.     Motor: No tremor or atrophy.     Gait: Gait normal.     Deep Tendon Reflexes:     Reflex Scores:      Patellar reflexes are 2+ on the right side and 2+ on the left side.      Achilles reflexes are 2+ on the right side and 2+ on the left side.    Comments: No strength deficit noted in hip and knee flexor and extensor muscle groups.  Ankle flexion and extension intact.  Equal grip strength     (all labs ordered are listed, but only abnormal results are displayed) Labs Reviewed - No data to display  EKG: None  Radiology: CT Thoracic Spine Wo Contrast Result Date: 02/16/2024 CLINICAL DATA:  Fall walking down stairs with mid and low back pain. EXAM: CT THORACIC AND LUMBAR SPINE WITHOUT CONTRAST TECHNIQUE: Multidetector CT imaging of the thoracic and lumbar spine was performed without contrast. Multiplanar CT image reconstructions were also generated. RADIATION DOSE REDUCTION: This exam was performed according to the departmental dose-optimization program which includes automated exposure control, adjustment of the mA and/or kV according to patient size and/or use of iterative reconstruction technique. COMPARISON:  Plain films earlier today.  CT 08/06/2014 FINDINGS: CT THORACIC SPINE FINDINGS Alignment: Normal. Vertebrae: Examination demonstrates acute compression deformities over the superior endplate of T11 and T12. No significant retropulsion of the compression fractures. No free fragments within the  spinal canal. Mild focal depression of the superior endplate of T7 likely due to Schmorl's node, although subtle fracture is possible. Remainder of the thoracic spine is unremarkable. Paraspinal and other soft tissues: Negative. Disc levels: Normal. Minimal calcified plaque of the thoracoabdominal aorta. Several hypodensities within the liver which are unchanged. CT LUMBAR SPINE FINDINGS Segmentation: Transitional vertebrae at the lumbosacral junction which will be called L6. Alignment: Normal. Vertebrae: Vertebral body heights are normal. There is minimal spondylosis of the lumbar spine. No acute compression fracture or subluxation involving the lumbar spine. Paraspinal  and other soft tissues: Normal. Disc levels: Moderate disc space narrowing at the L5-L6 level. Minimal broad-based disc bulge at the L3-4 level and L4-5 levels. Minimal broad-based disc bulge at the L5-6 level. IMPRESSION: 1. Acute compression fractures over the superior endplate of T11 and T12 without significant retropulsion of the compression fractures. No free fragments within the spinal canal. 2. Mild focal depression of the superior endplate of T7 likely due to Schmorl's node, although subtle fracture is possible. 3. No acute compression fracture or subluxation of the lumbar spine. 4. Minimal spondylosis of the lumbar spine with moderate disc space narrowing at the L5-L6 level. Minimal broad-based disc bulges at the L3-4, L4-5 and L5-6 levels. 5. Aortic atherosclerosis. Aortic Atherosclerosis (ICD10-I70.0). Electronically Signed   By: Toribio Agreste M.D.   On: 02/16/2024 15:37   CT Lumbar Spine Wo Contrast Result Date: 02/16/2024 CLINICAL DATA:  Fall walking down stairs with mid and low back pain. EXAM: CT THORACIC AND LUMBAR SPINE WITHOUT CONTRAST TECHNIQUE: Multidetector CT imaging of the thoracic and lumbar spine was performed without contrast. Multiplanar CT image reconstructions were also generated. RADIATION DOSE REDUCTION: This exam  was performed according to the departmental dose-optimization program which includes automated exposure control, adjustment of the mA and/or kV according to patient size and/or use of iterative reconstruction technique. COMPARISON:  Plain films earlier today.  CT 08/06/2014 FINDINGS: CT THORACIC SPINE FINDINGS Alignment: Normal. Vertebrae: Examination demonstrates acute compression deformities over the superior endplate of T11 and T12. No significant retropulsion of the compression fractures. No free fragments within the spinal canal. Mild focal depression of the superior endplate of T7 likely due to Schmorl's node, although subtle fracture is possible. Remainder of the thoracic spine is unremarkable. Paraspinal and other soft tissues: Negative. Disc levels: Normal. Minimal calcified plaque of the thoracoabdominal aorta. Several hypodensities within the liver which are unchanged. CT LUMBAR SPINE FINDINGS Segmentation: Transitional vertebrae at the lumbosacral junction which will be called L6. Alignment: Normal. Vertebrae: Vertebral body heights are normal. There is minimal spondylosis of the lumbar spine. No acute compression fracture or subluxation involving the lumbar spine. Paraspinal and other soft tissues: Normal. Disc levels: Moderate disc space narrowing at the L5-L6 level. Minimal broad-based disc bulge at the L3-4 level and L4-5 levels. Minimal broad-based disc bulge at the L5-6 level. IMPRESSION: 1. Acute compression fractures over the superior endplate of T11 and T12 without significant retropulsion of the compression fractures. No free fragments within the spinal canal. 2. Mild focal depression of the superior endplate of T7 likely due to Schmorl's node, although subtle fracture is possible. 3. No acute compression fracture or subluxation of the lumbar spine. 4. Minimal spondylosis of the lumbar spine with moderate disc space narrowing at the L5-L6 level. Minimal broad-based disc bulges at the L3-4, L4-5  and L5-6 levels. 5. Aortic atherosclerosis. Aortic Atherosclerosis (ICD10-I70.0). Electronically Signed   By: Toribio Agreste M.D.   On: 02/16/2024 15:37     Procedures   Medications Ordered in the ED  HYDROcodone -acetaminophen  (NORCO/VICODIN) 5-325 MG per tablet 1 tablet (1 tablet Oral Given 02/16/24 1220)  HYDROcodone -acetaminophen  (NORCO/VICODIN) 5-325 MG per tablet 1 tablet (1 tablet Oral Given 02/16/24 1652)                                    Medical Decision Making Patient presenting from home with a fall from less than 2 foot height that she was standing  on the second to the bottom step in her home when she fell, she did not hit her head, landed on her left buttock and hip area but complaining mostly of pain mid lumbar to lower thoracic region.  Her exam is reassuring, she has no neurodeficits on exam and moves all extremities without pain.  She appears able to give a good history despite her diagnosis of Alzheimer's.  Husband is at bedside and assists with history as well.  Imaging obtained, initial x-rays suggesting possible thoracic compression fracture, she was sent back for CT imaging for better detail with results outlined below.  Discussed these findings, is a very mild and appears to be stable fractures however she may be an excellent candidate for kyphoplasty for more immediate relief of pain and is being referred to interventional radiology to consider this.  She and her husband are agreeable to this plan.  In the interim she was prescribed hydrocodone  and was also given a Lidoderm  patch which she can continue to use if she finds them helpful.  Caution was given regarding hydrocodone  including drowsiness and constipation.  Amount and/or Complexity of Data Reviewed Radiology: ordered.    Details: Imaging reviewed, agree with interpretation, she has no pelvis or hip fractures.  CT Scan confirms mild compression fractures of the superior endplate of T11 and T12.  Risk Prescription  drug management.        Final diagnoses:  Compression fracture of T11 vertebra, initial encounter (HCC)  Compression fracture of T12 vertebra, initial encounter [facility])    ED Discharge Orders          Ordered    HYDROcodone -acetaminophen  (NORCO/VICODIN) 5-325 MG tablet  Every 6 hours PRN        02/16/24 1639    lidocaine  (LIDODERM ) 5 %  Every 24 hours        02/16/24 1639               Maleeka Sabatino, PA-C 02/18/24 1352    Patsey Lot, MD 02/25/24 1434

## 2024-02-16 NOTE — ED Notes (Signed)
 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

## 2024-02-16 NOTE — Discharge Instructions (Signed)
 You have been prescribed some pain medication along with the pain patch to put on your site of pain and injury in your back.  Use caution with the hydrocodone  as this will make you drowsy.  I recommend taking this medication 2 hours apart from your nighttime nortriptyline .  This medication can also cause constipation and I recommend you taking a stool softener while you are on this medicine.  A good choice for this would be Colace.  Call the number listed above to arrange further evaluation of your back and whether you would be a good candidate for procedure to heal this injury and pain quickly.

## 2024-02-16 NOTE — ED Triage Notes (Signed)
 Pt arrived via pOV frmo home c/o lumbar back pain that began after a fall walking down stairs at home. Pt reports she missed a step and fell apprx two stairs to the hallway floor. Pt reports twisting her back while she fell.

## 2024-02-17 ENCOUNTER — Other Ambulatory Visit: Payer: Self-pay | Admitting: Emergency Medicine

## 2024-02-17 DIAGNOSIS — S22000A Wedge compression fracture of unspecified thoracic vertebra, initial encounter for closed fracture: Secondary | ICD-10-CM

## 2024-02-24 ENCOUNTER — Other Ambulatory Visit (HOSPITAL_COMMUNITY): Payer: Self-pay | Admitting: Interventional Radiology

## 2024-02-24 ENCOUNTER — Ambulatory Visit
Admission: RE | Admit: 2024-02-24 | Discharge: 2024-02-24 | Disposition: A | Discharge status: Home / Self Care | Source: Ambulatory Visit | Attending: Emergency Medicine | Admitting: Emergency Medicine

## 2024-02-24 DIAGNOSIS — S22088A Other fracture of T11-T12 vertebra, initial encounter for closed fracture: Secondary | ICD-10-CM | POA: Diagnosis not present

## 2024-02-24 DIAGNOSIS — S22080A Wedge compression fracture of T11-T12 vertebra, initial encounter for closed fracture: Secondary | ICD-10-CM

## 2024-02-24 DIAGNOSIS — S22000A Wedge compression fracture of unspecified thoracic vertebra, initial encounter for closed fracture: Secondary | ICD-10-CM

## 2024-02-24 NOTE — Consult Note (Signed)
 Chief Complaint: Patient was seen in consultation today for painful compression fractures at the request of Pickering,Nathan  Referring Physician(s): Pickering,Nathan PCP: Jayce COOK  History of Present Illness: Destiny Norman is a 78 y.o. female h/o Alzheimers, osteopenia,  02/16/24 fell down stairs at home, had back pain. CT showed mild T11 and T12 subacute compression fractures, no retropulsion. No LE weakness, nor cord compression symptoms. Currently using Vicodin for pain control with only partial relief of symptoms.  Scores 22/24 on  [employer] disability questionaire  Past Medical History:  Diagnosis Date   Breast cancer (HCC)    Early onset Alzheimer dementia (HCC)    per Pts spouse   High cholesterol    Hypertension    Vitamin D  deficiency     Past Surgical History:  Procedure Laterality Date   BIOPSY  04/27/2023   Procedure: BIOPSY;  Surgeon: Eartha Angelia Sieving, MD;  Location: [facility];  Service: Gastroenterology;;   CESAREAN SECTION     COLONOSCOPY N/A 07/04/2014   Surgeon: Claudis RAYMOND Rivet, MD; incomplete to distal transverse colon secondary to very tortuous sigmoid colon and splenic flexure.  She completed virtual colonoscopy 08/06/2014 which did not show any polyps.   COLONOSCOPY WITH PROPOFOL  N/A 04/27/2023   Procedure: COLONOSCOPY WITH PROPOFOL ;  Surgeon: Eartha Angelia Sieving, MD;  Location: [facility];  Service: Gastroenterology;  Laterality: N/A;  12:45 pm, asa 2   ESOPHAGEAL DILATION N/A 11/05/2020   Procedure: ESOPHAGEAL DILATION;  Surgeon: Rivet Claudis RAYMOND, MD;  Location: [facility];  Service: Endoscopy;  Laterality: N/A;   ESOPHAGOGASTRODUODENOSCOPY (EGD) WITH PROPOFOL  N/A 11/05/2020   Surgeon: Rivet Claudis RAYMOND, MD; 2 cm hiatal hernia, otherwise normal exam.  Empiric esophageal dilation was performed.   ESOPHAGOGASTRODUODENOSCOPY (EGD) WITH PROPOFOL  N/A 04/27/2023   Procedure: ESOPHAGOGASTRODUODENOSCOPY (EGD) WITH  PROPOFOL ;  Surgeon: Eartha Angelia Sieving, MD;  Location: [facility];  Service: Gastroenterology;  Laterality: N/A;   MASTECTOMY Right 05/24/2002   RADIOLOGY WITH ANESTHESIA N/A 09/10/2014   Procedure: MRI ABDOMIN WITH AND WITHOUT;  Surgeon: Medication Radiologist, MD;  Location: [facility];  Service: Radiology;  Laterality: N/A;   RECONSTRUCTION BREAST W/ TRAM FLAP      Allergies: Penicillin g and Penicillins  Medications: Prior to Admission medications   Medication Sig Start Date End Date Taking? Authorizing Provider  Cholecalciferol (VITAMIN D3) 1000 UNITS CAPS Take 1,000 Units by mouth daily.    [provider]  cyanocobalamin  (VITAMIN B12) 1000 MCG tablet Take 1 tablet (1,000 mcg total) by mouth daily. 10/23/22   Cook, Jayce G, DO  famotidine  (PEPCID ) 20 MG tablet Take 1 tablet (20 mg total) by mouth at bedtime. 10/20/23 10/19/24  Kennedy Charmaine CROME, NP  gabapentin  (NEURONTIN ) 300 MG capsule Take 1 capsule (300 mg total) by mouth at bedtime. Patient taking differently: Take 300 mg by mouth as needed. 10/10/23   Cook, Jayce G, DO  HYDROcodone -acetaminophen  (NORCO/VICODIN) 5-325 MG tablet Take 1 tablet by mouth every 6 (six) hours as needed. 02/16/24   Idol, Julie, PA-C  Iron , Ferrous Sulfate , 325 (65 Fe) MG TABS Take 325 mg by mouth every other day. 10/23/22   Cook, Jayce G, DO  lidocaine  (LIDODERM ) 5 % Place 1 patch onto the skin daily. Remove & Discard patch within 12 hours or as directed by MD 02/16/24   Idol, Julie, PA-C  lisinopril-hydrochlorothiazide (ZESTORETIC) 10-12.5 MG tablet TAKE (1) TABLET BY MOUTH ONCE DAILY. 07/10/23   Cook, Jayce G, DO  memantine  (NAMENDA )  10 MG tablet Take 1 tablet (10 mg total) by mouth 2 (two) times daily. 11/24/23   Onita Duos, MD  metoprolol succinate (TOPROL-XL) 25 MG 24 hr tablet TAKE (1) TABLET BY MOUTH ONCE DAILY. 07/11/23   Cook, Jayce G, DO  nortriptyline  (PAMELOR ) 10 MG capsule Take 3 capsules (30 mg total) by mouth at bedtime. 12/29/23   Onita Duos, MD  omeprazole  (PRILOSEC) 20 MG capsule Take 1 capsule (20 mg total) by mouth 2 (two) times daily before a meal. 10/31/23   Mahon, Charmaine CROME, NP  rosuvastatin  (CRESTOR ) 10 MG tablet Take 1 tablet (10 mg total) by mouth daily. 08/29/23   Cook, Jayce G, DO  valACYclovir  (VALTREX ) 1000 MG tablet 2 g twice daily for 1 day. At the first sign of cold sore. Patient taking differently: as needed. 2 g twice daily for 1 day. At the first sign of cold sore. 04/18/23   Bluford Jacqulyn MATSU, DO     Family History  Problem Relation Age of Onset   Hypertension Mother    Lung cancer Mother    Prostate cancer Father    Skin cancer Sister    Melanoma Sister    Cancer Brother    Prostate cancer Brother    Healthy Son    Sleep apnea Neg Hx     Social History   Socioeconomic History   Marital status: Married    Spouse name: Not on file   Number of children: Not on file   Years of education: Not on file   Highest education level: Not on file  Occupational History   Not on file  Tobacco Use   Smoking status: Never    Passive exposure: Never   Smokeless tobacco: Never  Vaping Use   Vaping status: Never Used  Substance and Sexual Activity   Alcohol use: Yes   Drug use: No   Sexual activity: Yes  Other Topics Concern   Not on file  Social History Narrative   Right handed   Caffiene-2 cups coffee   Retired, Manufacturing engineer for [employer]   Social Drivers of Corporate investment banker Strain: Low Risk  (01/06/2024)   Overall Financial Resource Strain (CARDIA)    Difficulty of Paying Living Expenses: Not hard at all  Food Insecurity: No Food Insecurity (01/06/2024)   Hunger Vital Sign    Worried About Running Out of Food in the Last Year: Never true    Ran Out of Food in the Last Year: Never true  Transportation Needs: No Transportation Needs (01/06/2024)   PRAPARE - Administrator, Civil Service (Medical): No    Lack of Transportation (Non-Medical): No  Physical  Activity: Sufficiently Active (01/06/2024)   Exercise Vital Sign    Days of Exercise per Week: 5 days    Minutes of Exercise per Session: 30 min  Stress: No Stress Concern Present (01/06/2024)   [employer] of Occupational Health - Occupational Stress Questionnaire    Feeling of Stress: Not at all  Social Connections: Moderately Isolated (01/06/2024)   Social Connection and Isolation Panel    Frequency of Communication with Friends and Family: More than three times a week    Frequency of Social Gatherings with Friends and Family: More than three times a week    Attends Religious Services: Never    Database administrator or Organizations: No    Attends Banker Meetings: Never    Marital Status: Married  ECOG Status: 2 - Symptomatic, <50% confined to bed  Review of Systems: A 12 point ROS discussed and pertinent positives are indicated in the HPI above.  All other systems are negative.  Review of Systems  Vital Signs: BP 121/82 (BP Location: Left Arm, Patient Position: Sitting, Cuff Size: Normal)   Pulse 70   Temp 97.8 F (36.6 C) (Oral)   Resp 18   SpO2 97%    Constitutional: Oriented to person, place, and time. Well-developed and well-nourished. No distress.   HENT:  Head: Normocephalic and atraumatic.  Eyes: Conjunctivae and EOM are normal. Right eye exhibits no discharge. Left eye exhibits no discharge. No scleral icterus.  Neck: No JVD present.  Pulmonary/Chest: Tender over lower thoracic spine. Effort normal. No stridor. No respiratory distress.  Abdomen: soft, non distended Neurological:  alert and oriented to person, place, and time.  Skin: Skin is warm and dry.  not diaphoretic.  Psychiatric:   normal mood and affect.   behavior is normal. Judgment and thought content normal.     Imaging: CT Thoracic Spine Wo Contrast Result Date: 02/16/2024 CLINICAL DATA:  Fall walking down stairs with mid and low back pain. EXAM: CT THORACIC AND LUMBAR SPINE  WITHOUT CONTRAST TECHNIQUE: Multidetector CT imaging of the thoracic and lumbar spine was performed without contrast. Multiplanar CT image reconstructions were also generated. RADIATION DOSE REDUCTION: This exam was performed according to the departmental dose-optimization program which includes automated exposure control, adjustment of the mA and/or kV according to patient size and/or use of iterative reconstruction technique. COMPARISON:  Plain films earlier today.  CT 08/06/2014 FINDINGS: CT THORACIC SPINE FINDINGS Alignment: Normal. Vertebrae: Examination demonstrates acute compression deformities over the superior endplate of T11 and T12. No significant retropulsion of the compression fractures. No free fragments within the spinal canal. Mild focal depression of the superior endplate of T7 likely due to Schmorl's node, although subtle fracture is possible. Remainder of the thoracic spine is unremarkable. Paraspinal and other soft tissues: Negative. Disc levels: Normal. Minimal calcified plaque of the thoracoabdominal aorta. Several hypodensities within the liver which are unchanged. CT LUMBAR SPINE FINDINGS Segmentation: Transitional vertebrae at the lumbosacral junction which will be called L6. Alignment: Normal. Vertebrae: Vertebral body heights are normal. There is minimal spondylosis of the lumbar spine. No acute compression fracture or subluxation involving the lumbar spine. Paraspinal and other soft tissues: Normal. Disc levels: Moderate disc space narrowing at the L5-L6 level. Minimal broad-based disc bulge at the L3-4 level and L4-5 levels. Minimal broad-based disc bulge at the L5-6 level. IMPRESSION: 1. Acute compression fractures over the superior endplate of T11 and T12 without significant retropulsion of the compression fractures. No free fragments within the spinal canal. 2. Mild focal depression of the superior endplate of T7 likely due to Schmorl's node, although subtle fracture is possible. 3.  No acute compression fracture or subluxation of the lumbar spine. 4. Minimal spondylosis of the lumbar spine with moderate disc space narrowing at the L5-L6 level. Minimal broad-based disc bulges at the L3-4, L4-5 and L5-6 levels. 5. Aortic atherosclerosis. Aortic Atherosclerosis (ICD10-I70.0). Electronically Signed   By: Toribio Agreste M.D.   On: 02/16/2024 15:37   CT Lumbar Spine Wo Contrast Result Date: 02/16/2024 CLINICAL DATA:  Fall walking down stairs with mid and low back pain. EXAM: CT THORACIC AND LUMBAR SPINE WITHOUT CONTRAST TECHNIQUE: Multidetector CT imaging of the thoracic and lumbar spine was performed without contrast. Multiplanar CT image reconstructions were also generated. RADIATION DOSE REDUCTION: This  exam was performed according to the departmental dose-optimization program which includes automated exposure control, adjustment of the mA and/or kV according to patient size and/or use of iterative reconstruction technique. COMPARISON:  Plain films earlier today.  CT 08/06/2014 FINDINGS: CT THORACIC SPINE FINDINGS Alignment: Normal. Vertebrae: Examination demonstrates acute compression deformities over the superior endplate of T11 and T12. No significant retropulsion of the compression fractures. No free fragments within the spinal canal. Mild focal depression of the superior endplate of T7 likely due to Schmorl's node, although subtle fracture is possible. Remainder of the thoracic spine is unremarkable. Paraspinal and other soft tissues: Negative. Disc levels: Normal. Minimal calcified plaque of the thoracoabdominal aorta. Several hypodensities within the liver which are unchanged. CT LUMBAR SPINE FINDINGS Segmentation: Transitional vertebrae at the lumbosacral junction which will be called L6. Alignment: Normal. Vertebrae: Vertebral body heights are normal. There is minimal spondylosis of the lumbar spine. No acute compression fracture or subluxation involving the lumbar spine. Paraspinal and  other soft tissues: Normal. Disc levels: Moderate disc space narrowing at the L5-L6 level. Minimal broad-based disc bulge at the L3-4 level and L4-5 levels. Minimal broad-based disc bulge at the L5-6 level. IMPRESSION: 1. Acute compression fractures over the superior endplate of T11 and T12 without significant retropulsion of the compression fractures. No free fragments within the spinal canal. 2. Mild focal depression of the superior endplate of T7 likely due to Schmorl's node, although subtle fracture is possible. 3. No acute compression fracture or subluxation of the lumbar spine. 4. Minimal spondylosis of the lumbar spine with moderate disc space narrowing at the L5-L6 level. Minimal broad-based disc bulges at the L3-4, L4-5 and L5-6 levels. 5. Aortic atherosclerosis. Aortic Atherosclerosis (ICD10-I70.0). Electronically Signed   By: Toribio Agreste M.D.   On: 02/16/2024 15:37   DG Pelvis 1-2 Views Result Date: 02/16/2024 CLINICAL DATA:  Low back and pelvic pain post fall down stairs this morning. EXAM: PELVIS - 1-2 VIEW COMPARISON:  None Available. FINDINGS: No evidence of acute hip or pelvic fracture. Mild degenerate change of the spine and sacroiliac joints. Clips over the bilateral lower abdomen/pelvis. IMPRESSION: No acute findings. Electronically Signed   By: Toribio Agreste M.D.   On: 02/16/2024 13:09   DG Lumbar Spine Complete Result Date: 02/16/2024 CLINICAL DATA:  Fall down stairs this morning with low back pain and pelvic pain. EXAM: LUMBAR SPINE - COMPLETE 4+ VIEW COMPARISON:  CT 08/06/2014. FINDINGS: Vertebral body alignment is normal. There is mild spondylosis of the visualized lower thoracic as well as the lumbar spine to include facet arthropathy over the lower lumbar spine. Transitional vertebrae at the lumbosacral junction which will be identified as L6. Minimal disc space narrowing at the L3-4 level and moderate disc space narrowing at the L5-L6 level. No compression fracture involving the  lumbar spine. Mild superior endplate depression of T11 and T12 which may be acute or chronic. This was not present on previous CT 2016. No evidence of subluxation. Multiple surgical clips over the abdomen. Remainder of the exam is unremarkable. IMPRESSION: 1. Mild superior endplate depression of T11 and T12 which may be acute or chronic. 2. 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 Electronically Signed   By: Toribio Agreste M.D.   On: 02/16/2024 13:08    Labs:  CBC: Recent Labs    09/19/23 1132  WBC 7.2  HGB 13.1  HCT 38.5  PLT 253    COAGS: No results for input(s):  INR, APTT in the last 8760 hours.  BMP: Recent Labs    04/11/23 1206  NA 140  K 4.3  CL 101  CO2 24  GLUCOSE 84  BUN 13  CALCIUM  9.4  CREATININE 0.97    LIVER FUNCTION TESTS: No results for input(s): BILITOT, AST, ALT, ALKPHOS, PROT, ALBUMIN in the last 8760 hours.  TUMOR MARKERS: No results for input(s): AFPTM, CEA, CA199, CHROMGRNA in the last 8760 hours.  Assessment and Plan:  My impression is that this patient has mild subacute  T11 and T12  compression fracture deformities which likely contribute or account for most of the low back pain.  Based on cross-sectional imaging, this would be anatomically approachable for percutaneous intervention.  No associated spinal stenosis or other contraindications.  No suggestion of metastatic disease or other pathologic findings to indicate a need for concomitant core biopsy. Given the  lack of adequate symptom relief with time and a fairly aggressive pain medication regimen, and   limitations of activities of daily living, the patient  is clinically an appropriate candidate for consideration of vertebral augmentation. I discussed with the patient and spouse   the pathophysiology of vertebral compression fracture deformities; the stable nature of these which does not require emergent treatment; natural  history which includes healing over some unpredictable number of months.  We discussed treatment options including watchful waiting, surgical fixation, and percutaneous kyphoplasty/vertebroplasty.  We discussed in detail the percutaneous kyphoplasty technique, anticipated benefits, time course to symptom resolution, possible risks and side effects.  We discussed his elevated risk of additional level fractures with or without vertebral augmentation.  We discussed the long-term need for continued bone building therapy managed by the patient's PCP.   They seemed to understand, and did ask appropriate questions. The patient is motivated to proceed with treatment ASAP.  Accordingly, we schedule percutaneous T11 and T12 kyphoplasty under moderate sedation as an outpatient at the patient's convenience, pending carrier approval if needed.   Thank you for this interesting consult.  I greatly enjoyed meeting Destiny Norman and look forward to participating in their care.  A copy of this report was sent to the requesting provider on this date.  Electronically Signed: Dayne Rashid [place]ight 02/24/2024, 2:14 PM   I spent a total of  30 Minutes   in face to face in clinical consultation, greater than 50% of which was counseling/coordinating care for painful thoracic subacute compression fractures.

## 2024-02-28 ENCOUNTER — Other Ambulatory Visit: Payer: Self-pay | Admitting: Diagnostic Radiology

## 2024-02-28 DIAGNOSIS — Z01818 Encounter for other preprocedural examination: Secondary | ICD-10-CM

## 2024-02-29 ENCOUNTER — Ambulatory Visit (HOSPITAL_COMMUNITY)
Admission: RE | Admit: 2024-02-29 | Discharge: 2024-02-29 | Disposition: A | Discharge status: Home / Self Care | Source: Ambulatory Visit | Attending: Interventional Radiology | Admitting: Interventional Radiology

## 2024-02-29 ENCOUNTER — Other Ambulatory Visit: Payer: Self-pay

## 2024-02-29 DIAGNOSIS — M8008XA Age-related osteoporosis with current pathological fracture, vertebra(e), initial encounter for fracture: Secondary | ICD-10-CM | POA: Insufficient documentation

## 2024-02-29 DIAGNOSIS — S22080A Wedge compression fracture of T11-T12 vertebra, initial encounter for closed fracture: Secondary | ICD-10-CM | POA: Diagnosis not present

## 2024-02-29 DIAGNOSIS — M546 Pain in thoracic spine: Secondary | ICD-10-CM | POA: Insufficient documentation

## 2024-02-29 DIAGNOSIS — W19XXXA Unspecified fall, initial encounter: Secondary | ICD-10-CM | POA: Insufficient documentation

## 2024-02-29 DIAGNOSIS — Z01818 Encounter for other preprocedural examination: Secondary | ICD-10-CM

## 2024-02-29 HISTORY — PX: IR KYPHO THORACIC WITH BONE BIOPSY: IMG5518

## 2024-02-29 HISTORY — PX: IR KYPHO EA ADDL LEVEL THORACIC OR LUMBAR: IMG5520

## 2024-02-29 LAB — PROTIME-INR
INR: 1 (ref 0.8–1.2)
Prothrombin Time: 13.4 s (ref 11.4–15.2)

## 2024-02-29 LAB — CBC
HCT: 40.7 % (ref 36.0–46.0)
Hemoglobin: 14.1 g/dL (ref 12.0–15.0)
MCH: 31.9 pg (ref 26.0–34.0)
MCHC: 34.6 g/dL (ref 30.0–36.0)
MCV: 92.1 fL (ref 80.0–100.0)
Platelets: 252 K/uL (ref 150–400)
RBC: 4.42 MIL/uL (ref 3.87–5.11)
RDW: 12.2 % (ref 11.5–15.5)
WBC: 7.5 K/uL (ref 4.0–10.5)
nRBC: 0 % (ref 0.0–0.2)

## 2024-02-29 MED ORDER — LIDOCAINE-EPINEPHRINE (PF) 1 %-1:200000 IJ SOLN
10.0000 mL | Freq: Once | INTRAMUSCULAR | Status: AC
  Administered 2024-02-29: 10 mL

## 2024-02-29 MED ORDER — BUPIVACAINE HCL (PF) 0.5 % IJ SOLN
INTRAMUSCULAR | Status: AC
Start: 2024-02-29 — End: 2024-02-29
  Filled 2024-02-29: qty 30

## 2024-02-29 MED ORDER — IOHEXOL 300 MG/ML  SOLN
50.0000 mL | Freq: Once | INTRAMUSCULAR | Status: AC | PRN
  Administered 2024-02-29: 1 mL

## 2024-02-29 MED ORDER — MIDAZOLAM HCL 2 MG/2ML IJ SOLN
INTRAMUSCULAR | Status: AC
  Filled 2024-02-29: qty 2

## 2024-02-29 MED ORDER — LIDOCAINE HCL (PF) 1 % IJ SOLN
10.0000 mL | Freq: Once | INTRAMUSCULAR | Status: AC
  Administered 2024-02-29: 10 mL

## 2024-02-29 MED ORDER — LIDOCAINE HCL (PF) 1 % IJ SOLN
INTRAMUSCULAR | Status: AC
  Filled 2024-02-29: qty 30

## 2024-02-29 MED ORDER — MIDAZOLAM HCL 2 MG/2ML IJ SOLN
INTRAMUSCULAR | Status: AC | PRN
  Administered 2024-02-29 (×4): 1 mg via INTRAVENOUS

## 2024-02-29 MED ORDER — VANCOMYCIN HCL IN DEXTROSE 1-5 GM/200ML-% IV SOLN
INTRAVENOUS | Status: AC | PRN
  Administered 2024-02-29: 1500 mg via INTRAVENOUS

## 2024-02-29 MED ORDER — FENTANYL CITRATE (PF) 100 MCG/2ML IJ SOLN
INTRAMUSCULAR | Status: AC
  Filled 2024-02-29: qty 2

## 2024-02-29 MED ORDER — VANCOMYCIN HCL IN DEXTROSE 1-5 GM/200ML-% IV SOLN
INTRAVENOUS | Status: AC
  Filled 2024-02-29: qty 400

## 2024-02-29 MED ORDER — VANCOMYCIN HCL 1500 MG/300ML IV SOLN: 1500.0000 mg | INTRAVENOUS | Status: DC

## 2024-02-29 MED ORDER — SODIUM CHLORIDE 0.9 % IV SOLN: INTRAVENOUS | Status: DC

## 2024-02-29 MED ORDER — FENTANYL CITRATE (PF) 100 MCG/2ML IJ SOLN
INTRAMUSCULAR | Status: AC | PRN
  Administered 2024-02-29 (×2): 25 ug via INTRAVENOUS
  Administered 2024-02-29: 50 ug via INTRAVENOUS
  Administered 2024-02-29 (×2): 25 ug via INTRAVENOUS

## 2024-02-29 NOTE — Procedures (Signed)
  Procedure:  Thoracic kyphoplasty T11 and T12  Preprocedure diagnosis: Diagnoses of Compression fracture of T11 vertebra, initial encounter (HCC) and Preop examination were pertinent to this visit. Postprocedure diagnosis: same EBL:    minimal Complications:   Asymptomatic epidural venous involvement @ T11  See full dictation in [employer].  CHARM Toribio Faes MD Main # [phone] Pager  [phone] Mobile [phone]

## 2024-02-29 NOTE — H&P (Signed)
 Chief Complaint: T11 and T12 Compression fractures - IR consult for T11 and T12 kyphoplasty  Referring Provider(s): Pickering,Nathan PCP: Jayce COOK  Supervising Physician: Johann Sieving  Patient Status: [facility] - Out-pt  History of Present Illness: Destiny Norman is a 78 y.o. female with hx of osteopenia, recent fall on 02/16/24. After her fall, she presented to the ED with midline back pain and had CT showing T11 and T12 compression fractures. No LE weakness or other associated neurological symptoms. She has had persistent back pain despite Vicodin therapy at home. Pt saw IR attending Dr. Johann in clinic on 02/24/24, with decision to proceed with T11 and T12 kyphoplasty.  Today pt reports continued midline lower thoracic/ upper lumbar back pain worse with movement and touch. Continues to have no associated neuro symptoms. No other complaints today. Had plain black coffee at 0900 this AM, no other food or drink. No blood thinner.   Patient is Full Code  Past Medical History:  Diagnosis Date   Breast cancer (HCC)    Early onset Alzheimer dementia (HCC)    per Pts spouse   High cholesterol    Hypertension    Vitamin D  deficiency     Past Surgical History:  Procedure Laterality Date   BIOPSY  04/27/2023   Procedure: BIOPSY;  Surgeon: Eartha Angelia Sieving, MD;  Location: [facility];  Service: Gastroenterology;;   CESAREAN SECTION     COLONOSCOPY N/A 07/04/2014   Surgeon: Claudis RAYMOND Rivet, MD; incomplete to distal transverse colon secondary to very tortuous sigmoid colon and splenic flexure.  She completed virtual colonoscopy 08/06/2014 which did not show any polyps.   COLONOSCOPY WITH PROPOFOL  N/A 04/27/2023   Procedure: COLONOSCOPY WITH PROPOFOL ;  Surgeon: Eartha Angelia Sieving, MD;  Location: [facility];  Service: Gastroenterology;  Laterality: N/A;  12:45 pm, asa 2   ESOPHAGEAL DILATION N/A 11/05/2020   Procedure: ESOPHAGEAL DILATION;  Surgeon: Rivet Claudis RAYMOND, MD;  Location: [facility];  Service: Endoscopy;  Laterality: N/A;   ESOPHAGOGASTRODUODENOSCOPY (EGD) WITH PROPOFOL  N/A 11/05/2020   Surgeon: Rivet Claudis RAYMOND, MD; 2 cm hiatal hernia, otherwise normal exam.  Empiric esophageal dilation was performed.   ESOPHAGOGASTRODUODENOSCOPY (EGD) WITH PROPOFOL  N/A 04/27/2023   Procedure: ESOPHAGOGASTRODUODENOSCOPY (EGD) WITH PROPOFOL ;  Surgeon: Eartha Angelia Sieving, MD;  Location: [facility];  Service: Gastroenterology;  Laterality: N/A;   MASTECTOMY Right 05/24/2002   RADIOLOGY WITH ANESTHESIA N/A 09/10/2014   Procedure: MRI ABDOMIN WITH AND WITHOUT;  Surgeon: Medication Radiologist, MD;  Location: [facility] OR;  Service: Radiology;  Laterality: N/A;   RECONSTRUCTION BREAST W/ TRAM FLAP      Allergies: Penicillin g and Penicillins  Medications: Prior to Admission medications   Medication Sig Start Date End Date Taking? Authorizing Provider  Cholecalciferol (VITAMIN D3) 1000 UNITS CAPS Take 1,000 Units by mouth daily.   Yes [provider]  cyanocobalamin  (VITAMIN B12) 1000 MCG tablet Take 1 tablet (1,000 mcg total) by mouth daily. 10/23/22  Yes Cook, Jayce G, DO  gabapentin  (NEURONTIN ) 300 MG capsule Take 1 capsule (300 mg total) by mouth at bedtime. Patient taking differently: Take 300 mg by mouth as needed. 10/10/23  Yes Cook, Jayce G, DO  Iron , Ferrous Sulfate , 325 (65 Fe) MG TABS Take 325 mg by mouth every other day. 10/23/22  Yes Cook, Jayce G, DO  lisinopril-hydrochlorothiazide (ZESTORETIC) 10-12.5 MG tablet TAKE (1) TABLET BY MOUTH ONCE DAILY. 07/10/23  Yes Cook, Jayce G, DO  memantine  (NAMENDA ) 10 MG tablet  Take 1 tablet (10 mg total) by mouth 2 (two) times daily. 11/24/23  Yes Onita Duos, MD  metoprolol succinate (TOPROL-XL) 25 MG 24 hr tablet TAKE (1) TABLET BY MOUTH ONCE DAILY. 07/11/23  Yes Cook, Jayce G, DO  nortriptyline  (PAMELOR ) 10 MG capsule Take 3 capsules (30 mg total) by mouth at bedtime. 12/29/23  Yes Onita Duos, MD  omeprazole   (PRILOSEC) 20 MG capsule Take 1 capsule (20 mg total) by mouth 2 (two) times daily before a meal. 10/31/23  Yes Mahon, Courtney L, NP  rosuvastatin  (CRESTOR ) 10 MG tablet Take 1 tablet (10 mg total) by mouth daily. 08/29/23  Yes Cook, Jayce G, DO  famotidine  (PEPCID ) 20 MG tablet Take 1 tablet (20 mg total) by mouth at bedtime. 10/20/23 10/19/24  Kennedy Charmaine CROME, NP  HYDROcodone -acetaminophen  (NORCO/VICODIN) 5-325 MG tablet Take 1 tablet by mouth every 6 (six) hours as needed. 02/16/24   Idol, Julie, PA-C  lidocaine  (LIDODERM ) 5 % Place 1 patch onto the skin daily. Remove & Discard patch within 12 hours or as directed by MD 02/16/24   Idol, Julie, PA-C  valACYclovir  (VALTREX ) 1000 MG tablet 2 g twice daily for 1 day. At the first sign of cold sore. Patient taking differently: as needed. 2 g twice daily for 1 day. At the first sign of cold sore. 04/18/23   Bluford Jacqulyn MATSU, DO     Family History  Problem Relation Age of Onset   Hypertension Mother    Lung cancer Mother    Prostate cancer Father    Skin cancer Sister    Melanoma Sister    Cancer Brother    Prostate cancer Brother    Healthy Son    Sleep apnea Neg Hx     Social History   Socioeconomic History   Marital status: Married    Spouse name: Not on file   Number of children: Not on file   Years of education: Not on file   Highest education level: Not on file  Occupational History   Not on file  Tobacco Use   Smoking status: Never    Passive exposure: Never   Smokeless tobacco: Never  Vaping Use   Vaping status: Never Used  Substance and Sexual Activity   Alcohol use: Yes   Drug use: No   Sexual activity: Yes  Other Topics Concern   Not on file  Social History Narrative   Right handed   Caffiene-2 cups coffee   Retired, Manufacturing engineer for [employer]   Social Drivers of Corporate investment banker Strain: Low Risk  (01/06/2024)   Overall Financial Resource Strain (CARDIA)    Difficulty of Paying Living  Expenses: Not hard at all  Food Insecurity: No Food Insecurity (01/06/2024)   Hunger Vital Sign    Worried About Running Out of Food in the Last Year: Never true    Ran Out of Food in the Last Year: Never true  Transportation Needs: No Transportation Needs (01/06/2024)   PRAPARE - Administrator, Civil Service (Medical): No    Lack of Transportation (Non-Medical): No  Physical Activity: Sufficiently Active (01/06/2024)   Exercise Vital Sign    Days of Exercise per Week: 5 days    Minutes of Exercise per Session: 30 min  Stress: No Stress Concern Present (01/06/2024)   [employer] of Occupational Health - Occupational Stress Questionnaire    Feeling of Stress: Not at all  Social Connections: Moderately Isolated (01/06/2024)  Social Advertising account executive    Frequency of Communication with Friends and Family: More than three times a week    Frequency of Social Gatherings with Friends and Family: More than three times a week    Attends Religious Services: Never    Database administrator or Organizations: No    Attends Banker Meetings: Never    Marital Status: Married     Review of Systems: A 12 point ROS discussed and pertinent positives are indicated in the HPI above.  All other systems are negative.   Vital Signs: BP (!) 159/79   Pulse (!) 53   Temp 98 F (36.7 C) (Oral)   Resp 15   Ht 5' (1.524 m)   Wt 130 lb (59 kg)   SpO2 99%   BMI 25.39 kg/m   Advance Care Plan: No documents on file  Physical Exam Vitals and nursing note reviewed.  Constitutional:      Appearance: Normal appearance.  HENT:     Mouth/Throat:     Mouth: Mucous membranes are moist.     Pharynx: Oropharynx is clear.  Cardiovascular:     Rate and Rhythm: Regular rhythm. Bradycardia present.  Pulmonary:     Effort: Pulmonary effort is normal.     Breath sounds: Normal breath sounds.  Abdominal:     Palpations: Abdomen is soft.     Tenderness: There is no  abdominal tenderness.  Musculoskeletal:     Comments: + ttp of midline lower thoracic spine overlying T11/T12 focally. No radiating pain. NV intact  Skin:    General: Skin is warm and dry.  Neurological:     Mental Status: She is alert and oriented to person, place, and time. Mental status is at baseline.     Imaging: CT Thoracic Spine Wo Contrast Result Date: 02/16/2024 CLINICAL DATA:  Fall walking down stairs with mid and low back pain. EXAM: CT THORACIC AND LUMBAR SPINE WITHOUT CONTRAST TECHNIQUE: Multidetector CT imaging of the thoracic and lumbar spine was performed without contrast. Multiplanar CT image reconstructions were also generated. RADIATION DOSE REDUCTION: This exam was performed according to the departmental dose-optimization program which includes automated exposure control, adjustment of the mA and/or kV according to patient size and/or use of iterative reconstruction technique. COMPARISON:  Plain films earlier today.  CT 08/06/2014 FINDINGS: CT THORACIC SPINE FINDINGS Alignment: Normal. Vertebrae: Examination demonstrates acute compression deformities over the superior endplate of T11 and T12. No significant retropulsion of the compression fractures. No free fragments within the spinal canal. Mild focal depression of the superior endplate of T7 likely due to Schmorl's node, although subtle fracture is possible. Remainder of the thoracic spine is unremarkable. Paraspinal and other soft tissues: Negative. Disc levels: Normal. Minimal calcified plaque of the thoracoabdominal aorta. Several hypodensities within the liver which are unchanged. CT LUMBAR SPINE FINDINGS Segmentation: Transitional vertebrae at the lumbosacral junction which will be called L6. Alignment: Normal. Vertebrae: Vertebral body heights are normal. There is minimal spondylosis of the lumbar spine. No acute compression fracture or subluxation involving the lumbar spine. Paraspinal and other soft tissues: Normal. Disc  levels: Moderate disc space narrowing at the L5-L6 level. Minimal broad-based disc bulge at the L3-4 level and L4-5 levels. Minimal broad-based disc bulge at the L5-6 level. IMPRESSION: 1. Acute compression fractures over the superior endplate of T11 and T12 without significant retropulsion of the compression fractures. No free fragments within the spinal canal. 2. Mild focal depression of the superior endplate  of T7 likely due to Schmorl's node, although subtle fracture is possible. 3. No acute compression fracture or subluxation of the lumbar spine. 4. Minimal spondylosis of the lumbar spine with moderate disc space narrowing at the L5-L6 level. Minimal broad-based disc bulges at the L3-4, L4-5 and L5-6 levels. 5. Aortic atherosclerosis. Aortic Atherosclerosis (ICD10-I70.0). Electronically Signed   By: Toribio Agreste M.D.   On: 02/16/2024 15:37   CT Lumbar Spine Wo Contrast Result Date: 02/16/2024 CLINICAL DATA:  Fall walking down stairs with mid and low back pain. EXAM: CT THORACIC AND LUMBAR SPINE WITHOUT CONTRAST TECHNIQUE: Multidetector CT imaging of the thoracic and lumbar spine was performed without contrast. Multiplanar CT image reconstructions were also generated. RADIATION DOSE REDUCTION: This exam was performed according to the departmental dose-optimization program which includes automated exposure control, adjustment of the mA and/or kV according to patient size and/or use of iterative reconstruction technique. COMPARISON:  Plain films earlier today.  CT 08/06/2014 FINDINGS: CT THORACIC SPINE FINDINGS Alignment: Normal. Vertebrae: Examination demonstrates acute compression deformities over the superior endplate of T11 and T12. No significant retropulsion of the compression fractures. No free fragments within the spinal canal. Mild focal depression of the superior endplate of T7 likely due to Schmorl's node, although subtle fracture is possible. Remainder of the thoracic spine is unremarkable.  Paraspinal and other soft tissues: Negative. Disc levels: Normal. Minimal calcified plaque of the thoracoabdominal aorta. Several hypodensities within the liver which are unchanged. CT LUMBAR SPINE FINDINGS Segmentation: Transitional vertebrae at the lumbosacral junction which will be called L6. Alignment: Normal. Vertebrae: Vertebral body heights are normal. There is minimal spondylosis of the lumbar spine. No acute compression fracture or subluxation involving the lumbar spine. Paraspinal and other soft tissues: Normal. Disc levels: Moderate disc space narrowing at the L5-L6 level. Minimal broad-based disc bulge at the L3-4 level and L4-5 levels. Minimal broad-based disc bulge at the L5-6 level. IMPRESSION: 1. Acute compression fractures over the superior endplate of T11 and T12 without significant retropulsion of the compression fractures. No free fragments within the spinal canal. 2. Mild focal depression of the superior endplate of T7 likely due to Schmorl's node, although subtle fracture is possible. 3. No acute compression fracture or subluxation of the lumbar spine. 4. Minimal spondylosis of the lumbar spine with moderate disc space narrowing at the L5-L6 level. Minimal broad-based disc bulges at the L3-4, L4-5 and L5-6 levels. 5. Aortic atherosclerosis. Aortic Atherosclerosis (ICD10-I70.0). Electronically Signed   By: Toribio Agreste M.D.   On: 02/16/2024 15:37   DG Pelvis 1-2 Views Result Date: 02/16/2024 CLINICAL DATA:  Low back and pelvic pain post fall down stairs this morning. EXAM: PELVIS - 1-2 VIEW COMPARISON:  None Available. FINDINGS: No evidence of acute hip or pelvic fracture. Mild degenerate change of the spine and sacroiliac joints. Clips over the bilateral lower abdomen/pelvis. IMPRESSION: No acute findings. Electronically Signed   By: Toribio Agreste M.D.   On: 02/16/2024 13:09   DG Lumbar Spine Complete Result Date: 02/16/2024 CLINICAL DATA:  Fall down stairs this morning with low back  pain and pelvic pain. EXAM: LUMBAR SPINE - COMPLETE 4+ VIEW COMPARISON:  CT 08/06/2014. FINDINGS: Vertebral body alignment is normal. There is mild spondylosis of the visualized lower thoracic as well as the lumbar spine to include facet arthropathy over the lower lumbar spine. Transitional vertebrae at the lumbosacral junction which will be identified as L6. Minimal disc space narrowing at the L3-4 level and moderate disc space narrowing at the  L5-L6 level. No compression fracture involving the lumbar spine. Mild superior endplate depression of T11 and T12 which may be acute or chronic. This was not present on previous CT 2016. No evidence of subluxation. Multiple surgical clips over the abdomen. Remainder of the exam is unremarkable. IMPRESSION: 1. Mild superior endplate depression of T11 and T12 which may be acute or chronic. 2. 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 Electronically Signed   By: Toribio Agreste M.D.   On: 02/16/2024 13:08    Labs:  CBC: Recent Labs    09/19/23 1132  WBC 7.2  HGB 13.1  HCT 38.5  PLT 253    COAGS: No results for input(s): INR, APTT in the last 8760 hours.  BMP: Recent Labs    04/11/23 1206  NA 140  K 4.3  CL 101  CO2 24  GLUCOSE 84  BUN 13  CALCIUM  9.4  CREATININE 0.97    LIVER FUNCTION TESTS: No results for input(s): BILITOT, AST, ALT, ALKPHOS, PROT, ALBUMIN in the last 8760 hours.  TUMOR MARKERS: No results for input(s): AFPTM, CEA, CA199, CHROMGRNA in the last 8760 hours.  Assessment and Plan:  Destiny Norman is a 78 y.o. female with hx of osteopenia, recent fall on 02/16/24. After her fall, she presented to the ED with midline back pain and had CT showing T11 and T12 compression fractures. No LE weakness or other associated neurological symptoms. She has had persistent back pain despite Vicodin therapy at home. Pt saw IR attending Dr. Johann in clinic on 02/24/24,  with decision to proceed with T11 and T12 kyphoplasty.  Risks and benefits of T11 and T12 kyphoplasty were discussed with the patient including, but not limited to education regarding the natural healing process of compression fractures without intervention, bleeding, infection, cement migration which may cause spinal cord damage, paralysis, pulmonary embolism or even death.  This interventional procedure involves the use of X-rays and because of the nature of the planned procedure, it is possible that we will have prolonged use of X-ray fluoroscopy.  Potential radiation risks to you include (but are not limited to) the following: - A slightly elevated risk for cancer  several years later in life. This risk is typically less than 0.5% percent. This risk is low in comparison to the normal incidence of human cancer, which is 33% for women and 50% for men according to the American Cancer Society. - Radiation induced injury can include skin redness, resembling a rash, tissue breakdown / ulcers and hair loss (which can be temporary or permanent).   The likelihood of either of these occurring depends on the difficulty of the procedure and whether you are sensitive to radiation due to previous procedures, disease, or genetic conditions.   IF your procedure requires a prolonged use of radiation, you will be notified and given written instructions for further action.  It is your responsibility to monitor the irradiated area for the 2 weeks following the procedure and to notify your physician if you are concerned that you have suffered a radiation induced injury.    All of the patient's questions were answered, patient is agreeable to proceed.  Consent signed and in chart.    Thank you for allowing our service to participate in Chaundra Abreu 's care.  Electronically Signed: Kimble VEAR Clas, PA-C   02/29/2024, 11:02 AM      I spent a total of  30 Minutes   in  face to face in clinical  consultation, greater than 50% of which was counseling/coordinating care for T11 and T12 kyphoplasty

## 2024-03-07 ENCOUNTER — Encounter (INDEPENDENT_AMBULATORY_CARE_PROVIDER_SITE_OTHER): Payer: Self-pay | Admitting: Gastroenterology

## 2024-03-13 ENCOUNTER — Telehealth: Payer: Self-pay

## 2024-03-13 NOTE — Telephone Encounter (Signed)
 Copied from CRM [id number]. Topic: Clinical - Prescription Issue >> Mar 13, 2024  9:52 AM Darshell M wrote: Reason for CRM: Patient husband. Per pharmacy patient can get bubble pack for all medications. Patient has dementia and experiences difficulty taking meds. Pharmacy needs a list of medications patient is taking. [facility]# [phone], Patient's husband CB#s [phone] home, [phone] cell

## 2024-03-23 ENCOUNTER — Ambulatory Visit
Admission: RE | Admit: 2024-03-23 | Discharge: 2024-03-23 | Disposition: A | Discharge status: Home / Self Care | Source: Ambulatory Visit | Attending: Student | Admitting: Student

## 2024-03-23 DIAGNOSIS — S22080A Wedge compression fracture of T11-T12 vertebra, initial encounter for closed fracture: Secondary | ICD-10-CM

## 2024-03-23 NOTE — Progress Notes (Signed)
 Patient ID: Destiny Norman, female   DOB: [birth year], 78 y.o.   MRN: [id number]       Chief Complaint: Patient was seen in consultation today for f/u thoracic kyphoplasty x2 at the request of Alvia Solmon Sara  Referring Physician(s): Pickering,Nathan PCP: Jayce COOK  History of Present Illness: Destiny Norman is a 78 y.o. female h/o Alzheimers, osteopenia,  02/16/24 fell down stairs at home, had back pain. CT showed mild T11 and T12 subacute compression fractures, no retropulsion. No LE weakness, nor cord compression symptoms.  Tried vicodin for pain control with only partial relief of symptoms.  Scored 22/24 on  Roland Morris disability questionnaire. 02/29/24 Technically successful kyphoplasty  T11 and T12 with some asymptomatic anterior epidural venous cement extension at T11  She has done well post discharge. Basically pain free. No activity limitations from back pain. No new lower extremity symptoms. Not using any pain meds.   Past Medical History:  Diagnosis Date   Breast cancer (HCC)    Early onset Alzheimer dementia (HCC)    per Pts spouse   High cholesterol    Hypertension    Vitamin D  deficiency     Past Surgical History:  Procedure Laterality Date   BIOPSY  04/27/2023   Procedure: BIOPSY;  Surgeon: Eartha Angelia Sieving, MD;  Location: [facility];  Service: Gastroenterology;;   CESAREAN SECTION     COLONOSCOPY N/A 07/04/2014   Surgeon: Claudis RAYMOND Rivet, MD; incomplete to distal transverse colon secondary to very tortuous sigmoid colon and splenic flexure.  She completed virtual colonoscopy 08/06/2014 which did not show any polyps.   COLONOSCOPY WITH PROPOFOL  N/A 04/27/2023   Procedure: COLONOSCOPY WITH PROPOFOL ;  Surgeon: Eartha Angelia Sieving, MD;  Location: [facility];  Service: Gastroenterology;  Laterality: N/A;  12:45 pm, asa 2   ESOPHAGEAL DILATION N/A 11/05/2020   Procedure: ESOPHAGEAL DILATION;  Surgeon: Rivet Claudis RAYMOND, MD;   Location: [facility];  Service: Endoscopy;  Laterality: N/A;   ESOPHAGOGASTRODUODENOSCOPY (EGD) WITH PROPOFOL  N/A 11/05/2020   Surgeon: Rivet Claudis RAYMOND, MD; 2 cm hiatal hernia, otherwise normal exam.  Empiric esophageal dilation was performed.   ESOPHAGOGASTRODUODENOSCOPY (EGD) WITH PROPOFOL  N/A 04/27/2023   Procedure: ESOPHAGOGASTRODUODENOSCOPY (EGD) WITH PROPOFOL ;  Surgeon: Eartha Angelia Sieving, MD;  Location: [facility];  Service: Gastroenterology;  Laterality: N/A;   IR KYPHO EA ADDL LEVEL THORACIC OR LUMBAR  02/29/2024   IR KYPHO THORACIC WITH BONE BIOPSY  02/29/2024   MASTECTOMY Right 05/24/2002   RADIOLOGY WITH ANESTHESIA N/A 09/10/2014   Procedure: MRI ABDOMIN WITH AND WITHOUT;  Surgeon: Medication Radiologist, MD;  Location: [facility];  Service: Radiology;  Laterality: N/A;   RECONSTRUCTION BREAST W/ TRAM FLAP      Allergies: Penicillin g and Penicillins  Medications: Prior to Admission medications   Medication Sig Start Date End Date Taking? Authorizing Provider  Cholecalciferol (VITAMIN D3) 1000 UNITS CAPS Take 1,000 Units by mouth daily.    [provider]  cyanocobalamin  (VITAMIN B12) 1000 MCG tablet Take 1 tablet (1,000 mcg total) by mouth daily. 10/23/22   Cook, Jayce G, DO  famotidine  (PEPCID ) 20 MG tablet Take 1 tablet (20 mg total) by mouth at bedtime. 10/20/23 10/19/24  Kennedy Charmaine CROME, NP  gabapentin  (NEURONTIN ) 300 MG capsule Take 1 capsule (300 mg total) by mouth at bedtime. Patient taking differently: Take 300 mg by mouth as needed. 10/10/23   Cook, Jayce G, DO  HYDROcodone -acetaminophen  (NORCO/VICODIN) 5-325 MG tablet Take 1 tablet by mouth  every 6 (six) hours as needed. 02/16/24   Idol, Julie, PA-C  Iron , Ferrous Sulfate , 325 (65 Fe) MG TABS Take 325 mg by mouth every other day. 10/23/22   Cook, Jayce G, DO  lidocaine  (LIDODERM ) 5 % Place 1 patch onto the skin daily. Remove & Discard patch within 12 hours or as directed by MD 02/16/24   Idol, Julie, PA-C   lisinopril-hydrochlorothiazide (ZESTORETIC) 10-12.5 MG tablet TAKE (1) TABLET BY MOUTH ONCE DAILY. 07/10/23   Cook, Jayce G, DO  memantine  (NAMENDA ) 10 MG tablet Take 1 tablet (10 mg total) by mouth 2 (two) times daily. 11/24/23   Onita Duos, MD  metoprolol succinate (TOPROL-XL) 25 MG 24 hr tablet TAKE (1) TABLET BY MOUTH ONCE DAILY. 07/11/23   Cook, Jayce G, DO  nortriptyline  (PAMELOR ) 10 MG capsule Take 3 capsules (30 mg total) by mouth at bedtime. 12/29/23   Onita Duos, MD  omeprazole  (PRILOSEC) 20 MG capsule Take 1 capsule (20 mg total) by mouth 2 (two) times daily before a meal. 10/31/23   Kennedy Charmaine CROME, NP  rosuvastatin  (CRESTOR ) 10 MG tablet Take 1 tablet (10 mg total) by mouth daily. 08/29/23   Cook, Jayce G, DO  valACYclovir  (VALTREX ) 1000 MG tablet 2 g twice daily for 1 day. At the first sign of cold sore. Patient taking differently: as needed. 2 g twice daily for 1 day. At the first sign of cold sore. 04/18/23   Bluford Jacqulyn MATSU, DO     Family History  Problem Relation Age of Onset   Hypertension Mother    Lung cancer Mother    Prostate cancer Father    Skin cancer Sister    Melanoma Sister    Cancer Brother    Prostate cancer Brother    Healthy Son    Sleep apnea Neg Hx     Social History   Socioeconomic History   Marital status: Married    Spouse name: Not on file   Number of children: Not on file   Years of education: Not on file   Highest education level: Not on file  Occupational History   Not on file  Tobacco Use   Smoking status: Never    Passive exposure: Never   Smokeless tobacco: Never  Vaping Use   Vaping status: Never Used  Substance and Sexual Activity   Alcohol use: Yes   Drug use: No   Sexual activity: Yes  Other Topics Concern   Not on file  Social History Narrative   Right handed   Caffiene-2 cups coffee   Retired, manufacturing engineer for [employer]   Social Drivers of Corporate Investment Banker Strain: Low Risk  (01/06/2024)   Overall  Financial Resource Strain (CARDIA)    Difficulty of Paying Living Expenses: Not hard at all  Food Insecurity: No Food Insecurity (01/06/2024)   Hunger Vital Sign    Worried About Running Out of Food in the Last Year: Never true    Ran Out of Food in the Last Year: Never true  Transportation Needs: No Transportation Needs (01/06/2024)   PRAPARE - Administrator, Civil Service (Medical): No    Lack of Transportation (Non-Medical): No  Physical Activity: Sufficiently Active (01/06/2024)   Exercise Vital Sign    Days of Exercise per Week: 5 days    Minutes of Exercise per Session: 30 min  Stress: No Stress Concern Present (01/06/2024)   [employer] of Occupational Health - Occupational Stress Questionnaire  Feeling of Stress: Not at all  Social Connections: Moderately Isolated (01/06/2024)   Social Connection and Isolation Panel    Frequency of Communication with Friends and Family: More than three times a week    Frequency of Social Gatherings with Friends and Family: More than three times a week    Attends Religious Services: Never    Database Administrator or Organizations: No    Attends Engineer, Structural: Never    Marital Status: Married    ECOG Status: 1 - Symptomatic but completely ambulatory  Review of Systems: A 12 point ROS discussed and pertinent positives are indicated in the HPI above.  All other systems are negative.  Review of Systems  Vital Signs: BP (!) 162/57 (BP Location: Right Arm, Patient Position: Sitting)   Temp 97.9 F (36.6 C)   Resp 16   SpO2 97%    Physical Exam  Constitutional: Oriented to person, place, and time. Well-developed and well-nourished. No distress.   HENT:  Head: Normocephalic and atraumatic.  Eyes: Conjunctivae and EOM are normal. Right eye exhibits no discharge. Left eye exhibits no discharge. No scleral icterus.  Neck: No JVD present.  Pulmonary/Chest: Effort normal. No stridor. No respiratory distress.   Abdomen: soft, non distended Neurological:  alert and oriented to person, place, and time.  Skin: Skin is warm and dry.  not diaphoretic.  Psychiatric:   normal mood and affect.   behavior is normal. Judgment and thought content normal.       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 See previous consultation COMPARISON: mr EXAM: KYPHOPLASTY AT THORACIC T11 KYPHOPLASTY AT THORACIC T12 TECHNIQUE: The procedure, risks (including but not limited to bleeding, infection, organ damage), benefits, and alternatives were explained to the patient. Questions regarding the procedure were encouraged and answered. The patient understands and consents to the procedure. The patient was placed prone on the fluoroscopic table. The skin overlying the lower thoracic region was then prepped and draped in the usual sterile fashion. Maximal barrier sterile technique was utilized including caps, mask, sterile gowns, sterile gloves, sterile drape, hand hygiene and skin antiseptic. Intravenous Fentanyl  150mcg and Versed  4mg  were administered as conscious sedation during continuous cardiorespiratory monitoring by the radiology RN, with a total moderate sedation time of 57 minutes. As antibiotic prophylaxis, vancomycin 1 gram was ordered pre-procedure and administered intravenously within one hour of incision. The right pedicle at T12 was then infiltrated with 1% lidocaine  followed by the advancement of a Kyphon trocar needle through the right pedicle into the posterior one-third. The trocar was removed and the osteo drill was advanced to the anterior third of the vertebral body. The osteo drill was retracted. Through the working cannula, a Kyphon inflatable bone tamp was advanced and positioned with the distal marker 5 mm from the anterior aspect of the cortex. Crossing of the midline was not seen on the AP projection. At this  time, the balloon was expanded using contrast via a Kyphon inflation syringe device via micro tubing. In similar fashion, the left pedicle at T12 was infiltrated with 1% lidocaine  followed by the advancement of a second Kyphon trocar needle through the left pedicle into the posterior third of the vertebral body. The osteo drill was coaxially advanced to the anterior right third. The osteo drill was exchanged for a Kyphon inflatable bone tamp, advanced to the 5 mm of the anterior aspect of the cortex. The  balloon was then expanded using contrast as above. Inflations were continued until there was near apposition across the midline and with the superior and the inferior end plates. The right pedicle at T11 was then infiltrated with 1% lidocaine  followed by the advancement of a Kyphon trocar needle through the right pedicle into the posterior one-third. The trocar was removed and the osteo drill was advanced to the anterior third of the vertebral body. The osteo drill was retracted. Through the working cannula, a Kyphon inflatable bone tamp was advanced and positioned with the distal marker 5 mm from the anterior aspect of the cortex. Crossing of the midline was  seen on the AP projection. At this time, the balloon was expanded using contrast via a Kyphon inflation syringe device via micro tubing. At this time, methylmethacrylate mixture was reconstituted in the Kyphon bone mixing device system. This was then loaded into the delivery mechanism, attached to Kyphon bone fillers. The balloons were deflated and removed followed by the instillation of methylmethacrylate mixture with excellent filling in the AP and lateral projections. No extravasation was noted in the disk spaces. Right anterolateral epidural venous cement extension was seen at T11. The patient tolerated the procedure well. The working cannulae and the bone filler were then retrieved and removed. COMPLICATIONS Epidural venous cement at T11 IMPRESSION: 1.  Status post vertebral body augmentation using balloon kyphoplasty at T11 and T12. 2. Per CMS reporting requirements (MIPS measure 24): Given the patients age of greater than 50 and the fracture site (hip, distal radius, or spine), the patient should be tested for osteoporosis using DXA, and the appropriate treatment considered based on the DXA results. Electronically signed by: Katheleen Faes MD 03/01/2024 09:19 AM EDT RP Workstation: [id number]   IR KYPHO EA ADDL LEVEL THORACIC OR LUMBAR Result Date: 03/01/2024 CLINICAL HISTORY: painful osteoporotic compression fractures of T11 and T12. See previous consultation COMPARISON: mr EXAM: KYPHOPLASTY AT THORACIC T11 KYPHOPLASTY AT THORACIC T12 TECHNIQUE: The procedure, risks (including but not limited to bleeding, infection, organ damage), benefits, and alternatives were explained to the patient. Questions regarding the procedure were encouraged and answered. The patient understands and consents to the procedure. The patient was placed prone on the fluoroscopic table. The skin overlying the lower thoracic region was then prepped and draped in the usual sterile fashion. Maximal barrier sterile technique was utilized including caps, mask, sterile gowns, sterile gloves, sterile drape, hand hygiene and skin antiseptic. Intravenous Fentanyl  150mcg and Versed  4mg  were administered as conscious sedation during continuous cardiorespiratory monitoring by the radiology RN, with a total moderate sedation time of 57 minutes. As antibiotic prophylaxis, vancomycin 1 gram was ordered pre-procedure and administered intravenously within one hour of incision. The right pedicle at T12 was then infiltrated with 1% lidocaine  followed by the advancement of a Kyphon trocar needle through the right pedicle into the posterior one-third. The trocar was removed and the osteo drill was advanced to the anterior third of the vertebral body. The osteo drill was retracted. Through the working  cannula, a Kyphon inflatable bone tamp was advanced and positioned with the distal marker 5 mm from the anterior aspect of the cortex. Crossing of the midline was not seen on the AP projection. At this time, the balloon was expanded using contrast via a Kyphon inflation syringe device via micro tubing. In similar fashion, the left pedicle at T12 was infiltrated with 1% lidocaine  followed by the advancement of a second Kyphon trocar needle through the left pedicle into the posterior third  of the vertebral body. The osteo drill was coaxially advanced to the anterior right third. The osteo drill was exchanged for a Kyphon inflatable bone tamp, advanced to the 5 mm of the anterior aspect of the cortex. The balloon was then expanded using contrast as above. Inflations were continued until there was near apposition across the midline and with the superior and the inferior end plates. The right pedicle at T11 was then infiltrated with 1% lidocaine  followed by the advancement of a Kyphon trocar needle through the right pedicle into the posterior one-third. The trocar was removed and the osteo drill was advanced to the anterior third of the vertebral body. The osteo drill was retracted. Through the working cannula, a Kyphon inflatable bone tamp was advanced and positioned with the distal marker 5 mm from the anterior aspect of the cortex. Crossing of the midline was  seen on the AP projection. At this time, the balloon was expanded using contrast via a Kyphon inflation syringe device via micro tubing. At this time, methylmethacrylate mixture was reconstituted in the Kyphon bone mixing device system. This was then loaded into the delivery mechanism, attached to Kyphon bone fillers. The balloons were deflated and removed followed by the instillation of methylmethacrylate mixture with excellent filling in the AP and lateral projections. No extravasation was noted in the disk spaces. Right anterolateral epidural venous cement  extension was seen at T11. The patient tolerated the procedure well. The working cannulae and the bone filler were then retrieved and removed. COMPLICATIONS Epidural venous cement at T11 IMPRESSION: 1. Status post vertebral body augmentation using balloon kyphoplasty at T11 and T12. 2. Per CMS reporting requirements (MIPS measure 24): Given the patients age of greater than 50 and the fracture site (hip, distal radius, or spine), the patient should be tested for osteoporosis using DXA, and the appropriate treatment considered based on the DXA results. Electronically signed by: Katheleen Faes MD 03/01/2024 09:19 AM EDT RP Workstation: [id number]    Labs:  CBC: Recent Labs    09/19/23 1132 02/29/24 1114  WBC 7.2 7.5  HGB 13.1 14.1  HCT 38.5 40.7  PLT 253 252    COAGS: Recent Labs    02/29/24 1114  INR 1.0    BMP: Recent Labs    04/11/23 1206  NA 140  K 4.3  CL 101  CO2 24  GLUCOSE 84  BUN 13  CALCIUM  9.4  CREATININE 0.97    LIVER FUNCTION TESTS: No results for input(s): BILITOT, AST, ALT, ALKPHOS, PROT, ALBUMIN in the last 8760 hours.  TUMOR MARKERS: No results for input(s): AFPTM, CEA, CA199, CHROMGRNA in the last 8760 hours.  Assessment and Plan:  78 year old female with T11-T12 compression fractures s/p technically successful kyphoplasty (02/29/24) with excellent clinical response. Asymptomatic, pain-free, and functionally recovered.   Continue activity as tolerated. No further pain medications needed. Routine osteoporosis management per primary team. Follow-up as needed for any recurrence of pain or new neurologic symptoms.  Thank you for this interesting consult.  I greatly enjoyed meeting Destiny Norman and look forward to participating in their care.  A copy of this report was sent to the requesting provider on this date.  Electronically Signed: Dayne Mariadelcarmen Corella 03/23/2024, 4:15 PM   I spent a total of    25 Minutes in face  to face in clinical consultation, greater than 50% of which was counseling/coordinating care for T11-12 compression fractures post kyphoplasty.

## 2024-03-28 NOTE — Telephone Encounter (Signed)
 Cook, Jayce G, DO      03/17/24  9:19 AM Please provide the pharmacy with her medication list.

## 2024-04-03 NOTE — Telephone Encounter (Signed)
 Per [facility]- Patient and husband need to go to [place] and ask to speak with Randine- Bring all her pill bottles of medicines she is taking when she goes and they will work on setting her up with bubble packs. Randine is head of that service and they must come in and speak with her.   Left message to return call

## 2024-04-04 NOTE — Telephone Encounter (Signed)
 Left message to inform  Per [facility]- Patient and husband need to go to [facility] and ask to speak with Randine- Bring all her pill bottles of medicines she is taking when she goes and they will work on setting her up with bubble packs. Randine is head of that service and they must come in and speak with her.

## 2024-04-10 ENCOUNTER — Telehealth: Payer: Self-pay

## 2024-04-10 NOTE — Telephone Encounter (Signed)
 Copied from CRM #[id number]. Topic: Clinical - Medication Question >> Apr 10, 2024 12:11 PM Kevelyn M wrote: Reason for CRM: Randine with [facility] needs a current med list for the patient sent to her.  Call back # [phone] Fax # [phone]

## 2024-05-27 ENCOUNTER — Other Ambulatory Visit: Payer: Self-pay | Admitting: Gastroenterology

## 2024-05-27 DIAGNOSIS — K219 Gastro-esophageal reflux disease without esophagitis: Secondary | ICD-10-CM

## 2024-06-22 ENCOUNTER — Other Ambulatory Visit: Payer: Self-pay | Admitting: Family Medicine

## 2024-06-26 ENCOUNTER — Other Ambulatory Visit (INDEPENDENT_AMBULATORY_CARE_PROVIDER_SITE_OTHER): Payer: Self-pay | Admitting: Gastroenterology

## 2024-06-26 NOTE — Telephone Encounter (Signed)
 Patient needs follow up for further refills.

## 2024-08-13 ENCOUNTER — Ambulatory Visit: Admitting: Gastroenterology

## 2025-01-11 ENCOUNTER — Ambulatory Visit
